# Patient Record
Sex: Male | Born: 1995 | Race: White | Hispanic: No | Marital: Single | Smoking: Never smoker
Health system: Southern US, Community
[De-identification: ages and names within clinical notes are randomized; demographics above are authoritative.]

## PROBLEM LIST (undated history)

## (undated) DIAGNOSIS — R569 Unspecified convulsions: Secondary | ICD-10-CM

## (undated) DIAGNOSIS — F840 Autistic disorder: Secondary | ICD-10-CM

## (undated) DIAGNOSIS — F6381 Intermittent explosive disorder: Secondary | ICD-10-CM

## (undated) HISTORY — DX: Unspecified convulsions: R56.9

## (undated) HISTORY — DX: Autistic disorder: F84.0

## (undated) HISTORY — PX: CRANIOTOMY FOR SUBDURAL IMPLANTATION OF ELECTRODE ARRAY: SUR340

## (undated) HISTORY — PX: OTHER SURGICAL HISTORY: SHX169

---
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
## 2014-11-01 ENCOUNTER — Ambulatory Visit (INDEPENDENT_AMBULATORY_CARE_PROVIDER_SITE_OTHER): Payer: BLUE CROSS/BLUE SHIELD | Admitting: Emergency Medicine

## 2014-11-01 ENCOUNTER — Emergency Department (HOSPITAL_COMMUNITY)
Admission: EM | Admit: 2014-11-01 | Discharge: 2014-11-01 | Disposition: A | Discharge status: Home / Self Care | Payer: BLUE CROSS/BLUE SHIELD | Attending: Emergency Medicine | Admitting: Emergency Medicine

## 2014-11-01 ENCOUNTER — Emergency Department (HOSPITAL_COMMUNITY): Payer: BLUE CROSS/BLUE SHIELD

## 2014-11-01 ENCOUNTER — Encounter (HOSPITAL_COMMUNITY): Payer: Self-pay | Admitting: Emergency Medicine

## 2014-11-01 VITALS — BP 124/80 | HR 88 | Temp 98.2°F | Resp 17 | Ht 67.0 in | Wt 120.0 lb

## 2014-11-01 DIAGNOSIS — F840 Autistic disorder: Secondary | ICD-10-CM | POA: Insufficient documentation

## 2014-11-01 DIAGNOSIS — Z79899 Other long term (current) drug therapy: Secondary | ICD-10-CM | POA: Insufficient documentation

## 2014-11-01 DIAGNOSIS — S0181XA Laceration without foreign body of other part of head, initial encounter: Secondary | ICD-10-CM

## 2014-11-01 DIAGNOSIS — W1839XA Other fall on same level, initial encounter: Secondary | ICD-10-CM | POA: Insufficient documentation

## 2014-11-01 DIAGNOSIS — Y939 Activity, unspecified: Secondary | ICD-10-CM | POA: Diagnosis not present

## 2014-11-01 DIAGNOSIS — R569 Unspecified convulsions: Secondary | ICD-10-CM

## 2014-11-01 DIAGNOSIS — G40909 Epilepsy, unspecified, not intractable, without status epilepticus: Secondary | ICD-10-CM | POA: Diagnosis not present

## 2014-11-01 DIAGNOSIS — Y998 Other external cause status: Secondary | ICD-10-CM | POA: Insufficient documentation

## 2014-11-01 DIAGNOSIS — S0990XA Unspecified injury of head, initial encounter: Secondary | ICD-10-CM

## 2014-11-01 DIAGNOSIS — Y929 Unspecified place or not applicable: Secondary | ICD-10-CM | POA: Insufficient documentation

## 2014-11-01 HISTORY — DX: Intermittent explosive disorder: F63.81

## 2014-11-01 LAB — GLUCOSE, POCT (MANUAL RESULT ENTRY): POC Glucose: 102 mg/dl — AB (ref 70–99)

## 2014-11-01 NOTE — ED Notes (Addendum)
Patient returned from CT. Care giver at bedside.

## 2014-11-01 NOTE — Progress Notes (Addendum)
   Subjective:    Patient ID: Roy Price, male    DOB: 05-30-1996, 19 y.o.   MRN: [id number] This chart was scribed for Lesle ChrisSteven Starr Urias, MD by Jolene Provostobert Halas, Medical Scribe. This patient was seen in Room 7 and the patient's care was started at 2:17 PM.  HPI  Pt has severe autism, and cannot communicate. Pt's caregiver and mother dictated the HPI. HPI Comments: Roy Price is a 19 y.o. male who [facility] with a hx of autism, and seizure disorder that had a seizure that lasted approximently 1 minute at around 12:30 PM. When the seizure began the pt fell from a standing posture and hit his head on the ground. Pt has multiple lascerations on his face. Pt's mother states that he currently has one seizure every three weeks. Pt's last seizure was Thursday. Pt's PCP is Maryellen Pileavid Rubin. Pt's neurog[facility]. The Pt is also [facility] at [facility].    Review of Systems  Unable to perform ROS: Other  Severe autism, nonverbal.     Objective:   Physical Exam  Constitutional: He is oriented to person, place, and time. He appears well-developed and well-nourished. No distress.  Patient laying stretcher with laceration in the fright frontal area of face. Minimal verbal communication. Answers simple questions but not correctly. PEERL. No obvious cranial nerve signs. Pt able to lift arms and legs.  HENT:  Head: Normocephalic and atraumatic.  Eyes: Pupils are equal, round, and reactive to light.  Neck: Neck supple.  Cardiovascular: Normal rate, regular rhythm and normal heart sounds.  Exam reveals no gallop and no friction rub.   No murmur heard. Pulmonary/Chest: Effort normal and breath sounds normal. No respiratory distress. He has no wheezes.  Abdominal: Soft. There is no tenderness.  Musculoskeletal: Normal range of motion.  Neurological: He is alert and oriented to person, place, and time. Coordination normal.  Somewhat somnolent. Essentially nonverbal. No focal  cranial nerve or moter sensory abnormalalities.   Skin: Skin is warm and dry. He is not diaphoretic.  Psychiatric: He has a normal mood and affect. His behavior is normal.  Nursing note and vitals reviewed.  Results for orders placed or performed in visit on 11/01/14  POCT glucose (manual entry)  Result Value Ref Range   POC Glucose 102 (A) 70 - 99 mg/dl       Assessment & Plan:   Pt with a long hx of seizures. Pt fell [place] [employer] striking his right forehead on floor with a 1 min seizure. Pt has a long hx of hard to control seizures. Last seizure on Thursday. Pt sent to hospital to consider imaging due to the fall, and difficult to evaluate neurological statsus due to his Asperger's syndrome. Pt stable during his time at urgent care, and seemed more responsive  prior to transport, consistent with post ictal state. I personally performed the services described in this documentation, which was scribed in my presence. The recorded information has been reviewed and is accurate.

## 2014-11-01 NOTE — ED Provider Notes (Signed)
CSN: [id number]     Arrival date & time 11/01/14  1504 History   First MD Initiated Contact with Patient 11/01/14 [phone]     Chief Complaint  Patient presents with  . Seizures     (Consider location/radiation/quality/duration/timing/severity/associated sxs/prior Treatment) The history is provided by the patient and a parent. History limited by: Level V caveat: Autism and communication deficits.   patient is brought to the emergency department after a seizure which was witnessed by his babysitter.  He is 19 years old and has a history of seizure disorder and autism.  He recently was seen at the [facility] clinic in November 2015 and was switched to Vimpat as a sole epileptic agent.  Since switching he has definitely decreased his seizures and his frequency of seizures per the family.  Family reports that the seizure seems somewhat closer together as compared to his previous seizures.  Only one witnessed seizure.  Normal postictal period per family.  Family reports returned to baseline mental status at this time.  Noted to be moving all 4 extremities in the bed.  Past Medical History  Diagnosis Date  . Autism   . Seizures   . Intermittent explosive disorder    Past Surgical History  Procedure Laterality Date  . Brain surgery    . Arm fracture      due to seizures  . Broken leg    . Craniotomy for subdural implantation of electrode array    . Other surgical history      Bilateral Frontal Sub- Pial Resections   Family History  Problem Relation Age of Onset  . Alcohol abuse Neg Hx   . Migraines Mother     Intractable Migraines  . Hypothyroidism Mother   . Stroke Maternal Grandfather     Died at 50   History  Substance Use Topics  . Smoking status: Never Smoker   . Smokeless tobacco: Never Used  . Alcohol Use: No    Review of Systems  Unable to perform ROS     Allergies  Dairy aid  Home Medications   Prior to Admission medications   Medication Sig Start Date End Date  Taking? Authorizing Provider  cholecalciferol (VITAMIN D) 400 UNITS TABS Take 2,000 Units by mouth at bedtime.     Historical Provider, MD  co-enzyme Q-10 30 MG capsule Take 200 mg by mouth at bedtime.    Historical Provider, MD  COQ10-ACETYLCARN-CARNOSINE PO Take 2 tablets by mouth daily at 12 noon.     Historical Provider, MD  Cyanocobalamin (VITAMIN B 12 PO) Take 1 Wafer by mouth at bedtime.    Historical Provider, MD  Cyanocobalamin (VITAMIN B-12 PO) Take 1 tablet by mouth 2 (two) times daily. Morning and afternoon    Historical Provider, MD  lacosamide (VIMPAT) 50 MG TABS TAKE 2 AND 1/2 TABLETS BY MOUTH TWICE DAILY 02/17/13   Elveria Rising, NP  LamoTRIgine 200 MG TB24 Take 200 mg by mouth 2 (two) times daily.    Historical Provider, MD  LevOCARNitine (CARNITINE PO) Take 15 mLs by mouth daily.    Historical Provider, MD  Multiple Vitamin (MULTIVITAMIN) tablet Take 1 tablet by mouth daily.    Historical Provider, MD  Omega-3 Fatty Acids (FISH OIL PO) Take 3 capsules by mouth 2 (two) times daily. gummies    Historical Provider, MD  Prenatal Ca Carb-B6-B12-FA (BP FOLINATAL PLUS B PO) Take 2 capsules by mouth daily.     Historical Provider, MD  vitamin C (ASCORBIC ACID)  500 MG tablet Take 500 mg by mouth daily.    Historical Provider, MD  vitamin E 400 UNIT capsule Take 400 Units by mouth daily.    Historical Provider, MD   BP 127/74 mmHg  Pulse 79  Temp(Src) 97.8 F (36.6 C) (Oral)  Resp 20  Ht 5\' 9"  (1.753 m)  Wt 117 lb (53.071 kg)  BMI 17.27 kg/m2  SpO2 99% Physical Exam  Constitutional: He appears well-developed and well-nourished.  HENT:  Small 1.5 cm laceration to his right anterior forehead without active bleeding.  Small abrasion to his chin and small abrasion to his right cheek.  No trismus or malocclusion.  Dentition is normal.  Eyes: EOM are normal.  Neck: Normal range of motion. Neck supple.  C-spine is nontender without cervical step-offs  Cardiovascular: Normal rate,  regular rhythm, normal heart sounds and intact distal pulses.   Pulmonary/Chest: Effort normal and breath sounds normal. No respiratory distress.  Abdominal: Soft. He exhibits no distension. There is no tenderness.  Musculoskeletal: Normal range of motion.  Full range of motion in major joints.  Grossly 5 out of 5 strength to bilateral upper lower extremity major muscle groups.  Neurological: He is alert.  Baseline mental status per family.  Follow simple commands  Skin: Skin is warm and dry.  Psychiatric: He has a normal mood and affect. Judgment normal.  Nursing note and vitals reviewed.   ED Course  Procedures (including critical care time)  LACERATION REPAIR Performed by: Lyanne CoAMPOS,Shanae Luo M Consent: Verbal consent obtained. Risks and benefits: risks, benefits and alternatives were discussed Patient identity confirmed: provided demographic data Time out performed prior to procedure Prepped and Draped in normal sterile fashion Wound explored Laceration Location: right forehead Laceration Length: 1.5cm No Foreign Bodies seen or palpated Anesthesia: none Amount of cleaning: standard Skin closure: dermabond Number of sutures or staples: dermabond Technique: dermabond Patient tolerance: Patient tolerated the procedure well with no immediate complications.   Labs Review Labs Reviewed - No data to display  Imaging Review Ct Head Wo Contrast  11/01/2014   CLINICAL DATA:  Seizure. Fall from standing position. Laceration above the right eyebrow. Prior craniotomy.  EXAM: CT HEAD WITHOUT CONTRAST  CT CERVICAL SPINE WITHOUT CONTRAST  TECHNIQUE: Multidetector CT imaging of the head and cervical spine was performed following the standard protocol without intravenous contrast. Multiplanar CT image reconstructions of the cervical spine were also generated.  COMPARISON:  Multiple exams, including 06/13/2012 and 05/23/2011  FINDINGS: CT HEAD FINDINGS  The brainstem, cerebellum, cerebral peduncles,  thalamus, basal ganglia, basilar cisterns, and ventricular system appear within normal limits. No intracranial hemorrhage, mass lesion, or acute CVA.  Small amount of gas in the subcutaneous tissues above the right superior orbital rim and along the right forehead, compatible with laceration. Small metallic clip like structures along the inner table of the skull bilaterally with calvarial thinning and suggestion of prior craniotomies. I do not observe an acute calvarial fracture.  Minimal chronic left maxillary sinusitis.  CT CERVICAL SPINE FINDINGS  No cervical spine fracture or significant abnormal subluxation. No prevertebral soft tissue swelling or significant bony lesion observed.  IMPRESSION: 1. No acute intracranial or acute cervical spine findings. 2. Gas in the subcutaneous tissues the right forehead compatible with laceration. 3. Minimal chronic left maxillary sinusitis. 4. Postoperative findings along the calvarium.   Electronically Signed   By: Gaylyn RongWalter  Liebkemann M.D.   On: 11/01/2014 16:49   Ct Cervical Spine Wo Contrast  11/01/2014   CLINICAL DATA:  Seizure. Fall from standing position. Laceration above the right eyebrow. Prior craniotomy.  EXAM: CT HEAD WITHOUT CONTRAST  CT CERVICAL SPINE WITHOUT CONTRAST  TECHNIQUE: Multidetector CT imaging of the head and cervical spine was performed following the standard protocol without intravenous contrast. Multiplanar CT image reconstructions of the cervical spine were also generated.  COMPARISON:  Multiple exams, including 06/13/2012 and 05/23/2011  FINDINGS: CT HEAD FINDINGS  The brainstem, cerebellum, cerebral peduncles, thalamus, basal ganglia, basilar cisterns, and ventricular system appear within normal limits. No intracranial hemorrhage, mass lesion, or acute CVA.  Small amount of gas in the subcutaneous tissues above the right superior orbital rim and along the right forehead, compatible with laceration. Small metallic clip like structures along the  inner table of the skull bilaterally with calvarial thinning and suggestion of prior craniotomies. I do not observe an acute calvarial fracture.  Minimal chronic left maxillary sinusitis.  CT CERVICAL SPINE FINDINGS  No cervical spine fracture or significant abnormal subluxation. No prevertebral soft tissue swelling or significant bony lesion observed.  IMPRESSION: 1. No acute intracranial or acute cervical spine findings. 2. Gas in the subcutaneous tissues the right forehead compatible with laceration. 3. Minimal chronic left maxillary sinusitis. 4. Postoperative findings along the calvarium.   Electronically Signed   By: Gaylyn Rong M.D.   On: 11/01/2014 16:49  I personally reviewed the imaging tests through PACS system I reviewed available ER/hospitalization records through the EMR    EKG Interpretation None      MDM   Final diagnoses:  None    Patient returned to baseline mental status.  Imaging negative.  Laceration repaired.  Infection warnings given.  Head injury warnings given.  Primary care follow-up.  He will touch base with his neurology team as well.    Lyanne Co, MD 11/01/14 [phone]

## 2014-11-01 NOTE — Discharge Instructions (Signed)
Tissue Adhesive Wound Care °Some cuts, wounds, lacerations, and incisions can be repaired by using tissue adhesive. Tissue adhesive is like glue. It holds the skin together, allowing for faster healing. It forms a strong bond on the skin in about 1 minute and reaches its full strength in about 2 or 3 minutes. The adhesive disappears naturally while the wound is healing. It is important to take proper care of your wound at home while it heals.  °HOME CARE INSTRUCTIONS  °· Showers are allowed. Do not soak the area containing the tissue adhesive. Do not take baths, swim, or use hot tubs. Do not use any soaps or ointments on the wound. Certain ointments can weaken the glue. °· If a bandage (dressing) has been applied, follow your health care provider's instructions for how often to change the dressing.   °· Keep the dressing dry if one has been applied.   °· Do not scratch, pick, or rub the adhesive.   °· Do not place tape over the adhesive. The adhesive could come off when pulling the tape off.   °· Protect the wound from further injury until it is healed.   °· Protect the wound from sun and tanning bed exposure while it is healing and for several weeks after healing.   °· Only take over-the-counter or prescription medicines as directed by your health care provider.   °· Keep all follow-up appointments as directed by your health care provider. °SEEK IMMEDIATE MEDICAL CARE IF:  °· Your wound becomes red, swollen, hot, or tender.   °· You develop a rash after the glue is applied. °· You have increasing pain in the wound.   °· You have a red streak that goes away from the wound.   °· You have pus coming from the wound.   °· You have increased bleeding. °· You have a fever. °· You have shaking chills.   °· You notice a bad smell coming from the wound.   °· Your wound or adhesive breaks open.   °MAKE SURE YOU:  °· Understand these instructions. °· Will watch your condition. °· Will get help right away if you are not doing  well or get worse. °Document Released: 02/14/2001 Document Revised: 06/11/2013 Document Reviewed: 03/12/2013 °Exit[employer]® Patient Information ©2015 [employer]. This information is not intended to replace advice given to you by your health care provider. Make sure you discuss any questions you have with your health care provider. ° °

## 2014-11-01 NOTE — ED Notes (Addendum)
History of autism and epilepsy; at lunch with caregiver, had a seizure and fell from standing. 2 inch laceration above right eyebrow. Per mother, he is at baseline for his neuro status. [facility] and soft cervical collar placed there and arranged transport to ED.

## 2016-07-20 ENCOUNTER — Encounter (HOSPITAL_COMMUNITY): Payer: Self-pay

## 2016-07-20 ENCOUNTER — Emergency Department (HOSPITAL_COMMUNITY): Payer: BLUE CROSS/BLUE SHIELD

## 2016-07-20 ENCOUNTER — Emergency Department (HOSPITAL_COMMUNITY)
Admission: EM | Admit: 2016-07-20 | Discharge: 2016-07-20 | Disposition: A | Discharge status: Home / Self Care | Payer: BLUE CROSS/BLUE SHIELD | Attending: Emergency Medicine | Admitting: Emergency Medicine

## 2016-07-20 DIAGNOSIS — S0181XA Laceration without foreign body of other part of head, initial encounter: Secondary | ICD-10-CM

## 2016-07-20 DIAGNOSIS — S50811A Abrasion of right forearm, initial encounter: Secondary | ICD-10-CM | POA: Insufficient documentation

## 2016-07-20 DIAGNOSIS — S01112A Laceration without foreign body of left eyelid and periocular area, initial encounter: Secondary | ICD-10-CM | POA: Insufficient documentation

## 2016-07-20 DIAGNOSIS — F840 Autistic disorder: Secondary | ICD-10-CM | POA: Diagnosis not present

## 2016-07-20 DIAGNOSIS — G40909 Epilepsy, unspecified, not intractable, without status epilepticus: Secondary | ICD-10-CM | POA: Diagnosis not present

## 2016-07-20 DIAGNOSIS — Y92219 Unspecified school as the place of occurrence of the external cause: Secondary | ICD-10-CM | POA: Diagnosis not present

## 2016-07-20 DIAGNOSIS — Y939 Activity, unspecified: Secondary | ICD-10-CM | POA: Diagnosis not present

## 2016-07-20 DIAGNOSIS — R569 Unspecified convulsions: Secondary | ICD-10-CM

## 2016-07-20 DIAGNOSIS — Y999 Unspecified external cause status: Secondary | ICD-10-CM | POA: Diagnosis not present

## 2016-07-20 DIAGNOSIS — W2209XA Striking against other stationary object, initial encounter: Secondary | ICD-10-CM | POA: Insufficient documentation

## 2016-07-20 DIAGNOSIS — S0993XA Unspecified injury of face, initial encounter: Secondary | ICD-10-CM | POA: Diagnosis present

## 2016-07-20 LAB — CBG MONITORING, ED: Glucose-Capillary: 116 mg/dL — ABNORMAL HIGH (ref 65–99)

## 2016-07-20 MED ORDER — ACETAMINOPHEN 500 MG PO TABS
1000.0000 mg | ORAL_TABLET | Freq: Once | ORAL | Status: AC
  Administered 2016-07-20: 1000 mg via ORAL
  Filled 2016-07-20: qty 2

## 2016-07-20 MED ORDER — LIDOCAINE-EPINEPHRINE-TETRACAINE (LET) SOLUTION
3.0000 mL | Freq: Once | NASAL | Status: AC
  Administered 2016-07-20: 3 mL via TOPICAL
  Filled 2016-07-20: qty 3

## 2016-07-20 MED ORDER — LIDOCAINE-EPINEPHRINE 1 %-1:100000 IJ SOLN
20.0000 mL | Freq: Once | INTRAMUSCULAR | Status: AC
  Administered 2016-07-20: 20 mL via INTRADERMAL
  Filled 2016-07-20: qty 1

## 2016-07-20 NOTE — ED Notes (Signed)
Dr. Adela LankFloyd at bedside to suture lac.

## 2016-07-20 NOTE — ED Notes (Signed)
EKG given to Dr. Floyd 

## 2016-07-20 NOTE — ED Notes (Signed)
Notified Brooke H (RN) pt CBG was 116.

## 2016-07-20 NOTE — ED Triage Notes (Signed)
Pt presents with witnessed seizure while at school.  Pt fell out of cafeteria chair with seizure x 45 seconds.  Pt has h/o seizures.

## 2016-07-20 NOTE — Discharge Instructions (Signed)
Look for redness, drainage, fever.  Return if this occurs.

## 2016-07-20 NOTE — ED Notes (Signed)
Family updated on status for CT scan.

## 2016-07-20 NOTE — ED Provider Notes (Signed)
[facility]-EMERGENCY DEPT Provider Note   CSN: [id number] Arrival date & time: 07/20/16  1345     History   Chief Complaint Chief Complaint  Patient presents with  . Seizures    HPI Roy Price is a 20 y.o. male.  [age] yo M with a chief complaint of a seizure. Patient has a history of autism as well as a seizure disorder. He was at school today when he had a seizure and struck his head on a desk. He is now back to his baseline. Is on [facility]. They deny any recent medication changes. He has been compliant with his medications.   The history is provided by the patient and a parent.  Seizures   This is a recurrent problem. The current episode started less than 1 hour ago. The problem has not changed since onset.There was 1 seizure. The most recent episode lasted 30 to 120 seconds. Pertinent negatives include no confusion, no headaches, no visual disturbance, no chest pain, no vomiting and no diarrhea. The episode was witnessed. There was no sensation of an aura present. The seizures did not continue in the ED. The seizure(s) had no focality. There has been no fever.    Past Medical History:  Diagnosis Date  . Autism   . Intermittent explosive disorder   . Seizures [facility](HCC)     Patient Active Problem List   Diagnosis Date Noted  . 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 Localization-related (focal) (partial) epilepsy and epileptic syndromes with complex partial seizures, without mention of intractable epilepsy 01/17/2013  . Intermittent explosive disorder 01/17/2013  . Nocturnal enuresis 01/17/2013  . Autistic disorder, current or active state 01/17/2013  . 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  . Leukocytopenia, unspecified 01/17/2013  . Encounter for long-term (current) use of other medications 01/17/2013  . Autism 06/17/2012  . Seizures (HCC) 06/17/2012    Past Surgical History:  Procedure Laterality  Date  . arm fracture     due to seizures  . brain surgery    . broken leg    . CRANIOTOMY FOR SUBDURAL IMPLANTATION OF ELECTRODE ARRAY    . OTHER SURGICAL HISTORY     Bilateral Frontal Sub- Pial Resections       Home Medications    Prior to Admission medications   Medication Sig Start Date End Date Taking? Authorizing Provider  cholecalciferol (VITAMIN D) 400 UNITS TABS Take 2,000 Units by mouth at bedtime.    Yes Historical Provider, MD  co-enzyme Q-10 30 MG capsule Take 30 mg by mouth at bedtime.    Yes Historical Provider, MD  Lacosamide (VIMPAT) 100 MG TABS Take 300 mg by mouth 2 (two) times daily.   Yes Historical Provider, MD  Omega-3 Fatty Acids (FISH OIL PO) Take 1 capsule by mouth daily. gummies    Yes Historical Provider, MD  PREVIDENT 5000 BOOSTER PLUS 1.1 % PSTE 1 application See admin instructions. Daily as directed 05/23/16  Yes Historical Provider, MD  vitamin C (ASCORBIC ACID) 500 MG tablet Take 500 mg by mouth daily.   Yes Historical Provider, MD  vitamin E 400 UNIT capsule Take 400 Units by mouth daily.   Yes Historical Provider, MD  lacosamide (VIMPAT) 50 MG TABS TAKE 2 AND 1/2 TABLETS BY MOUTH TWICE DAILY Patient not taking: Reported on 07/20/2016 02/17/13   Elveria Risingina Goodpasture, NP    Family History Family History  Problem Relation Age of Onset  . Migraines  Mother     Intractable Migraines  . Hypothyroidism Mother   . Stroke Maternal Grandfather     Died at 110  . Alcohol abuse Neg Hx     Social History Social History  Substance Use Topics  . Smoking status: Never Smoker  . Smokeless tobacco: Never Used  . Alcohol use No     Allergies   Dairy aid [lactase]   Review of Systems Review of Systems  Constitutional: Negative for chills and fever.  HENT: Negative for congestion and facial swelling.   Eyes: Negative for discharge and visual disturbance.  Respiratory: Negative for shortness of breath.   Cardiovascular: Negative for chest pain and  palpitations.  Gastrointestinal: Negative for abdominal pain, diarrhea and vomiting.  Musculoskeletal: Negative for arthralgias and myalgias.  Skin: Negative for color change and rash.  Neurological: Positive for seizures. Negative for tremors, syncope and headaches.  Psychiatric/Behavioral: Negative for confusion and dysphoric mood.     Physical Exam Updated Vital Signs BP 108/66   Pulse 82   Resp 19   SpO2 96%   Physical Exam  Constitutional: He appears well-developed and well-nourished.  HENT:  Head: Normocephalic and atraumatic.  2.6 cm laceration above the left eyebrow.  Eyes: EOM are normal. Pupils are equal, round, and reactive to light.  Neck: Normal range of motion. Neck supple. No JVD present.  Cardiovascular: Normal rate and regular rhythm.  Exam reveals no gallop and no friction rub.   No murmur heard. Pulmonary/Chest: No respiratory distress. He has no wheezes.  Abdominal: He exhibits no distension and no mass. There is no tenderness. There is no rebound and no guarding.  Musculoskeletal: Normal range of motion.  Small abrasion to the right distal forearm.  He was palpated from head to toe with no other noted areas of bony tenderness. No midline spinal tenderness.  Neurological: He is alert.  Patient is moving all 4 extremities without difficulty.  Skin: No rash noted. No pallor.  Psychiatric: He has a normal mood and affect. His behavior is normal.  Nursing note and vitals reviewed.    ED Treatments / Results  Labs (all labs ordered are listed, but only abnormal results are displayed) Labs Reviewed  CBG MONITORING, ED - Abnormal; Notable for the following:       Result Value   Glucose-Capillary 116 (*)    All other components within normal limits    EKG  EKG Interpretation None      ED ECG REPORT   Date: 07/20/2016  Rate: 113  Rhythm: sinus tachycardia  QRS Axis: left  Intervals: normal  ST/T Wave abnormalities: normal  Conduction  Disutrbances:none  Narrative Interpretation:   Old EKG Reviewed: unchanged  I have personally reviewed the EKG tracing and agree with the computerized printout as noted.  Radiology Ct Head Wo Contrast  Result Date: 07/20/2016 CLINICAL DATA:  Head injury. Witnessed seizure. Fall from standing position, hitting head on a chair. Right frontal region laceration. Initial encounter. EXAM: CT HEAD WITHOUT CONTRAST CT CERVICAL SPINE WITHOUT CONTRAST TECHNIQUE: Multidetector CT imaging of the head and cervical spine was performed following the standard protocol without intravenous contrast. Multiplanar CT image reconstructions of the cervical spine were also generated. COMPARISON:  11/01/2014 FINDINGS: CT HEAD FINDINGS Brain: There is no evidence of acute cortical infarct, intracranial hemorrhage, mass, midline shift, or extra-axial fluid collection. Slight prominence of the ventricles and sulci for age is unchanged. Vascular: No hyperdense vessel or unexpected calcification. Skull: Prior craniotomy changes bilaterally. No  evidence of acute fracture. Sinuses/Orbits: No acute finding. Other: None. CT CERVICAL SPINE FINDINGS Alignment: Cervical spine straightening.  No listhesis. Skull base and vertebrae: No acute fracture. No primary bone lesion or focal pathologic process. Soft tissues and spinal canal: No prevertebral fluid or swelling. No visible canal hematoma. Disc levels: Preserved disc space heights without evidence of significant degenerative change. Upper chest: Unremarkable. Other: None. IMPRESSION: 1. No evidence of acute intracranial abnormality. 2. No evidence of acute cervical spine fracture or subluxation. Electronically Signed   By: Sebastian Ache M.D.   On: 07/20/2016 15:12   Ct Cervical Spine Wo Contrast  Result Date: 07/20/2016 CLINICAL DATA:  Head injury. Witnessed seizure. Fall from standing position, hitting head on a chair. Right frontal region laceration. Initial encounter. EXAM: CT HEAD  WITHOUT CONTRAST CT CERVICAL SPINE WITHOUT CONTRAST TECHNIQUE: Multidetector CT imaging of the head and cervical spine was performed following the standard protocol without intravenous contrast. Multiplanar CT image reconstructions of the cervical spine were also generated. COMPARISON:  11/01/2014 FINDINGS: CT HEAD FINDINGS Brain: There is no evidence of acute cortical infarct, intracranial hemorrhage, mass, midline shift, or extra-axial fluid collection. Slight prominence of the ventricles and sulci for age is unchanged. Vascular: No hyperdense vessel or unexpected calcification. Skull: Prior craniotomy changes bilaterally. No evidence of acute fracture. Sinuses/Orbits: No acute finding. Other: None. CT CERVICAL SPINE FINDINGS Alignment: Cervical spine straightening.  No listhesis. Skull base and vertebrae: No acute fracture. No primary bone lesion or focal pathologic process. Soft tissues and spinal canal: No prevertebral fluid or swelling. No visible canal hematoma. Disc levels: Preserved disc space heights without evidence of significant degenerative change. Upper chest: Unremarkable. Other: None. IMPRESSION: 1. No evidence of acute intracranial abnormality. 2. No evidence of acute cervical spine fracture or subluxation. Electronically Signed   By: Sebastian Ache M.D.   On: 07/20/2016 15:12    Procedures .Marland KitchenLaceration Repair Date/Time: 07/20/2016 3:22 PM Performed by: Adela Lank Caterina Racine Authorized by: Melene Plan   Consent:    Consent obtained:  Verbal   Consent given by:  Parent   Risks discussed:  Infection, pain, poor cosmetic result and poor wound healing   Alternatives discussed:  Delayed treatment, observation and no treatment Anesthesia (see MAR for exact dosages):    Anesthesia method:  Local infiltration   Local anesthetic:  Lidocaine 1% WITH epi Laceration details:    Location:  Face   Face location:  L eyebrow   Length (cm):  2.6 Repair type:    Repair type:  Simple Pre-procedure details:      Preparation:  Patient was prepped and draped in usual sterile fashion Exploration:    Contaminated: no   Treatment:    Wound cleansed with: chlorihexidene.   Amount of cleaning:  Standard   Irrigation solution:  Sterile saline   Irrigation volume:  20   Irrigation method:  Syringe   Visualized foreign bodies/material removed: no   Skin repair:    Repair method:  Sutures   Suture size:  5-0   Suture material:  Fast-absorbing gut   Suture technique:  Simple interrupted   Number of sutures:  2 Approximation:    Approximation:  Close Post-procedure details:    Dressing:  Open (no dressing)   Patient tolerance of procedure:  Tolerated well, no immediate complications    (including critical care time)  Medications Ordered in ED Medications  lidocaine-EPINEPHrine-tetracaine (LET) solution (3 mLs Topical Given 07/20/16 1411)  acetaminophen (TYLENOL) tablet 1,000 mg (1,000 mg Oral  Given 07/20/16 1410)  lidocaine-EPINEPHrine (XYLOCAINE W/EPI) 1 %-1:100000 (with pres) injection 20 mL (20 mLs Intradermal Given 07/20/16 1411)     Initial Impression / Assessment and Plan / ED Course  I have reviewed the triage vital signs and the nursing notes.  Pertinent labs & imaging results that were available during my care of the patient were reviewed by me and considered in my medical decision making (see chart for details).  Clinical Course     20 yo M With a chief complaint of a seizure. Patient has a history of a seizure disorder is at his baseline currently. He also has a laceration above the left eyebrow. Will obtain a CT of the head and C-spine. Repair the wound at bedside.  3:27 PM:  I have discussed the diagnosis/risks/treatment options with the patient and family and believe the pt to be eligible for discharge home to follow-up with PCP. We also discussed returning to the ED immediately if new or worsening sx occur. We discussed the sx which are most concerning (e.g., redness,  drainage) that necessitate immediate return. Medications administered to the patient during their visit and any new prescriptions provided to the patient are listed below.  Medications given during this visit Medications  lidocaine-EPINEPHrine-tetracaine (LET) solution (3 mLs Topical Given 07/20/16 1411)  acetaminophen (TYLENOL) tablet 1,000 mg (1,000 mg Oral Given 07/20/16 1410)  lidocaine-EPINEPHrine (XYLOCAINE W/EPI) 1 %-1:100000 (with pres) injection 20 mL (20 mLs Intradermal Given 07/20/16 1411)     The patient appears reasonably screen and/or stabilized for discharge and I doubt any other medical condition or other [facility] requiring further screening, evaluation, or treatment in the ED at this time prior to discharge.    Final Clinical Impressions(s) / ED Diagnoses   Final diagnoses:  Seizure [facility](HCC)  Facial laceration, initial encounter    New Prescriptions New Prescriptions   No medications on file     Melene PlanDan Teola Felipe, DO 07/20/16 1527

## 2017-01-12 ENCOUNTER — Encounter (HOSPITAL_COMMUNITY): Payer: Self-pay | Admitting: Emergency Medicine

## 2017-01-12 ENCOUNTER — Emergency Department (HOSPITAL_COMMUNITY): Payer: Medicaid Other

## 2017-01-12 ENCOUNTER — Emergency Department (HOSPITAL_COMMUNITY)
Admission: EM | Admit: 2017-01-12 | Discharge: 2017-01-12 | Disposition: A | Discharge status: Home / Self Care | Payer: Medicaid Other | Attending: Emergency Medicine | Admitting: Emergency Medicine

## 2017-01-12 DIAGNOSIS — S0990XA Unspecified injury of head, initial encounter: Secondary | ICD-10-CM | POA: Diagnosis present

## 2017-01-12 DIAGNOSIS — F840 Autistic disorder: Secondary | ICD-10-CM | POA: Diagnosis not present

## 2017-01-12 DIAGNOSIS — S60511A Abrasion of right hand, initial encounter: Secondary | ICD-10-CM | POA: Diagnosis not present

## 2017-01-12 DIAGNOSIS — S0081XA Abrasion of other part of head, initial encounter: Secondary | ICD-10-CM | POA: Diagnosis not present

## 2017-01-12 DIAGNOSIS — R569 Unspecified convulsions: Secondary | ICD-10-CM | POA: Diagnosis not present

## 2017-01-12 DIAGNOSIS — Y999 Unspecified external cause status: Secondary | ICD-10-CM | POA: Diagnosis not present

## 2017-01-12 DIAGNOSIS — Y9301 Activity, walking, marching and hiking: Secondary | ICD-10-CM | POA: Insufficient documentation

## 2017-01-12 DIAGNOSIS — W228XXA Striking against or struck by other objects, initial encounter: Secondary | ICD-10-CM | POA: Diagnosis not present

## 2017-01-12 DIAGNOSIS — Y92480 Sidewalk as the place of occurrence of the external cause: Secondary | ICD-10-CM | POA: Diagnosis not present

## 2017-01-12 DIAGNOSIS — S60512A Abrasion of left hand, initial encounter: Secondary | ICD-10-CM | POA: Insufficient documentation

## 2017-01-12 DIAGNOSIS — S60519A Abrasion of unspecified hand, initial encounter: Secondary | ICD-10-CM

## 2017-01-12 MED ORDER — BACITRACIN ZINC 500 UNIT/GM EX OINT
TOPICAL_OINTMENT | Freq: Once | CUTANEOUS | Status: AC
  Administered 2017-01-12: 2 via TOPICAL
  Filled 2017-01-12: qty 1.8

## 2017-01-12 NOTE — ED Notes (Signed)
Made multiple attempts to contact pt mother and father. Number goes straight to vmail. Pt teacher at bedside and sts that she has made attempts as well but unable to contact.

## 2017-01-12 NOTE — Discharge Instructions (Signed)
There is a slight abnormality on the left frontal lobe which is chronic. Not from the fall but could be associated with seizures. Neurology may need to follow this. Besides the abrasions no apparent severe injury from the fall.
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
## 2017-04-07 ENCOUNTER — Ambulatory Visit (INDEPENDENT_AMBULATORY_CARE_PROVIDER_SITE_OTHER): Payer: BLUE CROSS/BLUE SHIELD | Admitting: Emergency Medicine

## 2017-04-07 ENCOUNTER — Encounter: Payer: Self-pay | Admitting: Emergency Medicine

## 2017-04-07 VITALS — BP 117/76 | HR 93 | Temp 98.0°F | Resp 18 | Ht 68.9 in | Wt 114.0 lb

## 2017-04-07 DIAGNOSIS — F840 Autistic disorder: Secondary | ICD-10-CM

## 2017-04-07 DIAGNOSIS — R569 Unspecified convulsions: Secondary | ICD-10-CM | POA: Diagnosis not present

## 2017-04-07 DIAGNOSIS — S0181XA Laceration without foreign body of other part of head, initial encounter: Secondary | ICD-10-CM

## 2017-04-07 NOTE — Patient Instructions (Addendum)
   IF you received an x-ray today, you will receive an invoice from [facility]. Please contact [facility] at [phone] with questions or concerns regarding your invoice.   IF you received labwork today, you will receive an invoice from [facility]. Please contact [facility] at [phone] with questions or concerns regarding your invoice.   Our billing staff will not be able to assist you with questions regarding bills from these companies.  You will be contacted with the lab results as soon as they are available. The fastest way to get your results is to activate your My Chart account. Instructions are located on the last page of this paperwork. If you have not heard from us regarding the results in 2 weeks, please contact this office.     Laceration Care, Adult A laceration is a cut that goes through all layers of the skin. The cut also goes into the tissue that is right under the skin. Some cuts heal on their own. Others need to be closed with stitches (sutures), staples, skin adhesive strips, or wound glue. Taking care of your cut lowers your risk of infection and helps your cut to heal better. How to take care of your cut For stitches or staples:  Keep the wound clean and dry.  If you were given a bandage (dressing), you should change it at least one time per day or as told by your doctor. You should also change it if it gets wet or dirty.  Keep the wound completely dry for the first 24 hours or as told by your doctor. After that time, you may take a shower or a bath. However, make sure that the wound is not soaked in water until after the stitches or staples have been removed.  Clean the wound one time each day or as told by your doctor: ? Wash the wound with soap and water. ? Rinse the wound with water until all of the soap comes off. ? Pat the wound dry with a clean towel. Do not rub the wound.  After you clean the wound, put a thin layer of antibiotic  ointment on it as told by your doctor. This ointment: ? Helps to prevent infection. ? Keeps the bandage from sticking to the wound.  Have your stitches or staples removed as told by your doctor. If your doctor used skin adhesive strips:  Keep the wound clean and dry.  If you were given a bandage, you should change it at least one time per day or as told by your doctor. You should also change it if it gets dirty or wet.  Do not get the skin adhesive strips wet. You can take a shower or a bath, but be careful to keep the wound dry.  If the wound gets wet, pat it dry with a clean towel. Do not rub the wound.  Skin adhesive strips fall off on their own. You can trim the strips as the wound heals. Do not remove any strips that are still stuck to the wound. They will fall off after a while. If your doctor used wound glue:  Try to keep your wound dry, but you may briefly wet it in the shower or bath. Do not soak the wound in water, such as by swimming.  After you take a shower or a bath, gently pat the wound dry with a clean towel. Do not rub the wound.  Do not do any activities that will make you really sweaty until   the skin glue has fallen off on its own.  Do not apply liquid, cream, or ointment medicine to your wound while the skin glue is still on.  If you were given a bandage, you should change it at least one time per day or as told by your doctor. You should also change it if it gets dirty or wet.  If a bandage is placed over the wound, do not let the tape for the bandage touch the skin glue.  Do not pick at the glue. The skin glue usually stays on for 5-10 days. Then, it falls off of the skin. General Instructions  To help prevent scarring, make sure to cover your wound with sunscreen whenever you are outside after stitches are removed, after adhesive strips are removed, or when wound glue stays in place and the wound is healed. Make sure to wear a sunscreen of at least 30  SPF.  Take over-the-counter and prescription medicines only as told by your doctor.  If you were given antibiotic medicine or ointment, take or apply it as told by your doctor. Do not stop using the antibiotic even if your wound is getting better.  Do not scratch or pick at the wound.  Keep all follow-up visits as told by your doctor. This is important.  Check your wound every day for signs of infection. Watch for: ? Redness, swelling, or pain. ? Fluid, blood, or pus.  Raise (elevate) the injured area above the level of your heart while you are sitting or lying down, if possible. Get help if:  You got a tetanus shot and you have any of these problems at the injection site: ? Swelling. ? Very bad pain. ? Redness. ? Bleeding.  You have a fever.  A wound that was closed breaks open.  You notice a bad smell coming from your wound or your bandage.  You notice something coming out of the wound, such as wood or glass.  Medicine does not help your pain.  You have more redness, swelling, or pain at the site of your wound.  You have fluid, blood, or pus coming from your wound.  You notice a change in the color of your skin near your wound.  You need to change the bandage often because fluid, blood, or pus is coming from the wound.  You start to have a new rash.  You start to have numbness around the wound. Get help right away if:  You have very bad swelling around the wound.  Your pain suddenly gets worse and is very bad.  You notice painful lumps near the wound or on skin that is anywhere on your body.  You have a red streak going away from your wound.  The wound is on your hand or foot and you cannot move a finger or toe like you usually can.  The wound is on your hand or foot and you notice that your fingers or toes look pale or bluish. This information is not intended to replace advice given to you by your health care provider. Make sure you discuss any questions  you have with your health care provider. Document Released: 02/07/2008 Document Revised: 01/27/2016 Document Reviewed: 08/17/2014 [employer] Interactive Patient Education  2018 [employer]  

## 2017-04-07 NOTE — Progress Notes (Signed)
Roy Price 20 y.o.   Chief Complaint  Patient presents with  . Fall    pt had a seizure today while standing up and feel    HISTORY OF PRESENT ILLNESS: This is a 21 y.o. male autistic presenting with facial laceration sustained today after a fall secondary to seizure; pt has h/o seizures; accompanied by father.  HPI   Prior to Admission medications   Medication Sig Start Date End Date Taking? Authorizing Provider  cholecalciferol (VITAMIN D) 400 UNITS TABS Take 2,000 Units by mouth at bedtime.    Yes [provider]  co-enzyme Q-10 30 MG capsule Take 30 mg by mouth at bedtime.    Yes [provider]  Lacosamide (VIMPAT) 100 MG TABS Take 300 mg by mouth 2 (two) times daily.   Yes [provider]  lacosamide (VIMPAT) 50 MG TABS TAKE 2 AND 1/2 TABLETS BY MOUTH TWICE DAILY 02/17/13  Yes Elveria RisingGoodpasture, Tina, NP  Omega-3 Fatty Acids (FISH OIL PO) Take 1 capsule by mouth daily. gummies    Yes [provider]  PREVIDENT 5000 BOOSTER PLUS 1.1 % PSTE 1 application See admin instructions. Daily as directed 05/23/16  Yes [provider]  vitamin C (ASCORBIC ACID) 500 MG tablet Take 500 mg by mouth daily.   Yes [provider]  vitamin E 400 UNIT capsule Take 400 Units by mouth daily.   Yes [provider]    Allergies  Allergen Reactions  . Dairy Aid [Lactase] Rash    Opioid effect     Patient Active Problem List   Diagnosis Date Noted  . 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 Localization-related (focal) (partial) epilepsy and epileptic syndromes with complex partial seizures, without mention of intractable epilepsy 01/17/2013  . Intermittent explosive disorder 01/17/2013  . Nocturnal enuresis 01/17/2013  . Autistic disorder, current or active state 01/17/2013  . 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  . Leukocytopenia, unspecified 01/17/2013  . Encounter for long-term (current)  use of other medications 01/17/2013  . Autism 06/17/2012  . Seizures (HCC) 06/17/2012    Past Medical History:  Diagnosis Date  . Autism   . Intermittent explosive disorder   . Seizures (HCC)     Past Surgical History:  Procedure Laterality Date  . arm fracture     due to seizures  . brain surgery    . broken leg    . CRANIOTOMY FOR SUBDURAL IMPLANTATION OF ELECTRODE ARRAY    . OTHER SURGICAL HISTORY     Bilateral Frontal Sub- Pial Resections    Social History   Social History  . Marital status: Single    Spouse name: N/A  . Number of children: N/A  . Years of education: N/A   Occupational History  . Not on file.   Social History Main Topics  . Smoking status: Never Smoker  . Smokeless tobacco: Never Used  . Alcohol use No  . Drug use: No  . Sexual activity: No   Other Topics Concern  . Not on file   Social History Narrative  . No narrative on file    Family History  Problem Relation Age of Onset  . Migraines Mother        Intractable Migraines  . Hypothyroidism Mother   . Stroke Maternal Grandfather        Died at 5283  . Alcohol abuse Neg Hx      Review of Systems  Unable to perform ROS: Other  Vitals:   04/07/17 1214  BP: 117/76  Pulse: 93  Resp: 18  Temp: 98 F (36.7 C)     Physical Exam  Constitutional: He is oriented to person, place, and time. He appears well-developed and well-nourished.  HENT:  Head: Normocephalic.  Eyes: Pupils are equal, round, and reactive to light. Conjunctivae and EOM are normal.  Neck: Normal range of motion.  Cardiovascular: Normal rate.   Pulmonary/Chest: Effort normal.  Neurological: He is alert and oriented to person, place, and time.  Skin: Skin is warm and dry. Capillary refill takes less than 2 seconds.  +laceration to outer right eyebrow, irregular 3 cm long.  Psychiatric: His behavior is normal.  Vitals reviewed.  Procedure Note: Lido with Epi injected; irrigated with NSS; no debri noted;  closed with 5 5-0 Nylon sutures. Tolerated procedure well. Father at bedside.   ASSESSMENT & PLAN: Roy Price was seen today for fall.  Diagnoses and all orders for this visit:  Facial laceration, initial encounter  Autism  Seizures [facility](HCC)     Patient Instructions       IF you received an x-ray today, you will receive an invoice from [facility]. Please contact [facility] at [phone] with questions or concerns regarding your invoice.   IF you received labwork today, you will receive an invoice from [facility]. Please contact [facility] at [phone] with questions or concerns regarding your invoice.   Our billing staff will not be able to assist you with questions regarding bills from these companies.  You will be contacted with the lab results as soon as they are available. The fastest way to get your results is to activate your My Chart account. Instructions are located on the last page of this paperwork. If you have not heard from us regarding the results in 2 weeks, please contact this office.      Laceration Care, Adult A laceration is a cut that goes through all layers of the skin. The cut also goes into the tissue that is right under the skin. Some cuts heal on their own. Others need to be closed with stitches (sutures), staples, skin adhesive strips, or wound glue. Taking care of your cut lowers your risk of infection and helps your cut to heal better. How to take care of your cut For stitches or staples:  Keep the wound clean and dry.  If you were given a bandage (dressing), you should change it at least one time per day or as told by your doctor. You should also change it if it gets wet or dirty.  Keep the wound completely dry for the first 24 hours or as told by your doctor. After that time, you may take a shower or a bath. However, make sure that the wound is not soaked in water until after the stitches or staples have been removed.  Clean the  wound one time each day or as told by your doctor: ? Wash the wound with soap and water. ? Rinse the wound with water until all of the soap comes off. ? Pat the wound dry with a clean towel. Do not rub the wound.  After you clean the wound, put a thin layer of antibiotic ointment on it as told by your doctor. This ointment: ? Helps to prevent infection. ? Keeps the bandage from sticking to the wound.  Have your stitches or staples removed as told by your doctor. If your doctor used skin adhesive strips:  Keep the wound clean and  dry.  If you were given a bandage, you should change it at least one time per day or as told by your doctor. You should also change it if it gets dirty or wet.  Do not get the skin adhesive strips wet. You can take a shower or a bath, but be careful to keep the wound dry.  If the wound gets wet, pat it dry with a clean towel. Do not rub the wound.  Skin adhesive strips fall off on their own. You can trim the strips as the wound heals. Do not remove any strips that are still stuck to the wound. They will fall off after a while. If your doctor used wound glue:  Try to keep your wound dry, but you may briefly wet it in the shower or bath. Do not soak the wound in water, such as by swimming.  After you take a shower or a bath, gently pat the wound dry with a clean towel. Do not rub the wound.  Do not do any activities that will make you really sweaty until the skin glue has fallen off on its own.  Do not apply liquid, cream, or ointment medicine to your wound while the skin glue is still on.  If you were given a bandage, you should change it at least one time per day or as told by your doctor. You should also change it if it gets dirty or wet.  If a bandage is placed over the wound, do not let the tape for the bandage touch the skin glue.  Do not pick at the glue. The skin glue usually stays on for 5-10 days. Then, it falls off of the skin. General  Instructions  To help prevent scarring, make sure to cover your wound with sunscreen whenever you are outside after stitches are removed, after adhesive strips are removed, or when wound glue stays in place and the wound is healed. Make sure to wear a sunscreen of at least 30 SPF.  Take over-the-counter and prescription medicines only as told by your doctor.  If you were given antibiotic medicine or ointment, take or apply it as told by your doctor. Do not stop using the antibiotic even if your wound is getting better.  Do not scratch or pick at the wound.  Keep all follow-up visits as told by your doctor. This is important.  Check your wound every day for signs of infection. Watch for: ? Redness, swelling, or pain. ? Fluid, blood, or pus.  Raise (elevate) the injured area above the level of your heart while you are sitting or lying down, if possible. Get help if:  You got a tetanus shot and you have any of these problems at the injection site: ? Swelling. ? Very bad pain. ? Redness. ? Bleeding.  You have a fever.  A wound that was closed breaks open.  You notice a bad smell coming from your wound or your bandage.  You notice something coming out of the wound, such as wood or glass.  Medicine does not help your pain.  You have more redness, swelling, or pain at the site of your wound.  You have fluid, blood, or pus coming from your wound.  You notice a change in the color of your skin near your wound.  You need to change the bandage often because fluid, blood, or pus is coming from the wound.  You start to have a new rash.  You start to have numbness  around the wound. Get help right away if:  You have very bad swelling around the wound.  Your pain suddenly gets worse and is very bad.  You notice painful lumps near the wound or on skin that is anywhere on your body.  You have a red streak going away from your wound.  The wound is on your hand or foot and you  cannot move a finger or toe like you usually can.  The wound is on your hand or foot and you notice that your fingers or toes look pale or bluish. This information is not intended to replace advice given to you by your health care provider. Make sure you discuss any questions you have with your health care provider. Document Released: 02/07/2008 Document Revised: 01/27/2016 Document Reviewed: 08/17/2014 Elsevier Interactive Patient Education  2018 Elsevier Inc.    Edwina Barth, MD [facility]

## 2017-06-15 ENCOUNTER — Encounter (HOSPITAL_COMMUNITY): Payer: Self-pay

## 2017-06-15 ENCOUNTER — Observation Stay (HOSPITAL_COMMUNITY)
Admission: EM | Admit: 2017-06-15 | Discharge: 2017-06-16 | DRG: 083 | Disposition: A | Discharge status: Home / Self Care | Payer: BLUE CROSS/BLUE SHIELD | Attending: Neurological Surgery | Admitting: Neurological Surgery

## 2017-06-15 ENCOUNTER — Emergency Department (HOSPITAL_COMMUNITY): Payer: BLUE CROSS/BLUE SHIELD

## 2017-06-15 DIAGNOSIS — W19XXXA Unspecified fall, initial encounter: Secondary | ICD-10-CM | POA: Diagnosis not present

## 2017-06-15 DIAGNOSIS — G40909 Epilepsy, unspecified, not intractable, without status epilepticus: Secondary | ICD-10-CM | POA: Diagnosis present

## 2017-06-15 DIAGNOSIS — I609 Nontraumatic subarachnoid hemorrhage, unspecified: Secondary | ICD-10-CM

## 2017-06-15 DIAGNOSIS — S065X9A Traumatic subdural hemorrhage with loss of consciousness of unspecified duration, initial encounter: Secondary | ICD-10-CM | POA: Diagnosis present

## 2017-06-15 DIAGNOSIS — S065XAA Traumatic subdural hemorrhage with loss of consciousness status unknown, initial encounter: Secondary | ICD-10-CM

## 2017-06-15 DIAGNOSIS — F840 Autistic disorder: Secondary | ICD-10-CM | POA: Diagnosis present

## 2017-06-15 DIAGNOSIS — S066X9A Traumatic subarachnoid hemorrhage with loss of consciousness of unspecified duration, initial encounter: Secondary | ICD-10-CM | POA: Diagnosis present

## 2017-06-15 DIAGNOSIS — S0990XA Unspecified injury of head, initial encounter: Secondary | ICD-10-CM | POA: Diagnosis present

## 2017-06-15 MED ORDER — LORAZEPAM 2 MG/ML IJ SOLN: 0.5000 mg | INTRAMUSCULAR | Status: DC | PRN

## 2017-06-15 MED ORDER — LACOSAMIDE 50 MG PO TABS: 50.0000 mg | ORAL_TABLET | Freq: Two times a day (BID) | ORAL | Status: DC

## 2017-06-15 MED ORDER — VITAMIN D 1000 UNITS PO TABS
2000.0000 [IU] | ORAL_TABLET | Freq: Every day | ORAL | Status: DC
Start: 2017-06-15 — End: 2017-06-16

## 2017-06-15 MED ORDER — VITAMIN E 180 MG (400 UNIT) PO CAPS
400.0000 [IU] | ORAL_CAPSULE | Freq: Every day | ORAL | Status: DC
  Filled 2017-06-15: qty 1

## 2017-06-15 MED ORDER — ACETAMINOPHEN 325 MG PO TABS
650.0000 mg | ORAL_TABLET | ORAL | Status: DC | PRN
  Administered 2017-06-16: 650 mg via ORAL
  Filled 2017-06-15: qty 2

## 2017-06-15 MED ORDER — SODIUM CHLORIDE 0.9 % IV SOLN
INTRAVENOUS | Status: DC
  Filled 2017-06-15: qty 1000

## 2017-06-15 MED ORDER — LACOSAMIDE 50 MG PO TABS: 250.0000 mg | ORAL_TABLET | Freq: Every day | ORAL | Status: DC

## 2017-06-15 MED ORDER — ACETAMINOPHEN 325 MG PO TABS
650.0000 mg | ORAL_TABLET | Freq: Once | ORAL | Status: DC
  Filled 2017-06-15: qty 2

## 2017-06-15 MED ORDER — LACOSAMIDE 200 MG PO TABS
300.0000 mg | ORAL_TABLET | Freq: Every morning | ORAL | Status: DC
  Administered 2017-06-16: 300 mg via ORAL
  Filled 2017-06-15: qty 2

## 2017-06-15 MED ORDER — COENZYME Q10 30 MG PO CAPS
30.0000 mg | ORAL_CAPSULE | Freq: Every day | ORAL | Status: DC
Start: 2017-06-15 — End: 2017-06-15

## 2017-06-15 MED ORDER — VITAMIN C 500 MG PO TABS: 500.0000 mg | ORAL_TABLET | Freq: Every day | ORAL | Status: DC

## 2017-06-15 MED ORDER — ONDANSETRON HCL 4 MG PO TABS: 4.0000 mg | ORAL_TABLET | Freq: Four times a day (QID) | ORAL | Status: DC | PRN

## 2017-06-15 MED ORDER — ONDANSETRON HCL 4 MG/2ML IJ SOLN: 4.0000 mg | Freq: Four times a day (QID) | INTRAMUSCULAR | Status: DC | PRN

## 2017-06-15 NOTE — H&P (Signed)
Subjective:   Patient is a 21 y.o. male seen regarding a fall post seizure. He has a history of epilepsy and autism. Reported by mother that patient fell after a seizure yesterday afternoon landing on his head. Since then he has had 2 seizures and multiple episodes of vomiting. Patients family states that he was acting different since his fall. On multiple seizure medications at home. Physical exam difficult to obtain with his history of autism.   Past Medical History:  Diagnosis Date  . Autism   . Intermittent explosive disorder   . Seizures (HCC)     Past Surgical History:  Procedure Laterality Date  . arm fracture     due to seizures  . brain surgery    . broken leg    . CRANIOTOMY FOR SUBDURAL IMPLANTATION OF ELECTRODE ARRAY    . OTHER SURGICAL HISTORY     Bilateral Frontal Sub- Pial Resections    Allergies  Allergen Reactions  . Dairy Aid [Lactase] Rash    Opioid effect     Social History  Substance Use Topics  . Smoking status: Never Smoker  . Smokeless tobacco: Never Used  . Alcohol use No    Family History  Problem Relation Age of Onset  . Migraines Mother        Intractable Migraines  . Hypothyroidism Mother   . Stroke Maternal Grandfather        Died at 60  . Alcohol abuse Neg Hx    Prior to Admission medications   Medication Sig Start Date End Date Taking? Authorizing Provider  cholecalciferol (VITAMIN D) 400 UNITS TABS Take 2,000 Units by mouth at bedtime.    Yes [provider]  co-enzyme Q-10 30 MG capsule Take 30 mg by mouth at bedtime.    Yes [provider]  lacosamide (VIMPAT) 50 MG TABS TAKE 2 AND 1/2 TABLETS BY MOUTH TWICE DAILY Patient taking differently: 300 MG IN THE MORNING AND 250 MG IN THE EVENING 02/17/13  Yes Goodpasture, Inetta Fermo, NP  Omega-3 Fatty Acids (FISH OIL PO) Take 1 capsule by mouth daily. gummies    Yes [provider]  vitamin E 400 UNIT capsule Take 400 Units by mouth daily.   Yes [provider]   PREVIDENT 5000 BOOSTER PLUS 1.1 % PSTE 1 application See admin instructions. Daily as directed 05/23/16   [provider]  vitamin C (ASCORBIC ACID) 500 MG tablet Take 500 mg by mouth daily.    [provider]     Review of Systems  Positive ROS:   All other systems have been reviewed and were otherwise negative with the exception of those mentioned in the HPI and as above.  Objective: Vital signs in last 24 hours: Temp:  [97.9 F (36.6 C)] 97.9 F (36.6 C) (10/12 1435) Pulse Rate:  [84-100] 84 (10/12 1730) Resp:  [17-18] 18 (10/12 1730) BP: (116-126)/(73-74) 126/74 (10/12 1730) SpO2:  [100 %] 100 % (10/12 1730) Weight:  [52.2 kg (115 lb)] 52.2 kg (115 lb) (10/12 1435)  General Appearance: Alert, cooperative, no distress, appears stated age Head: Normocephalic, without obvious abnormality, atraumatic Eyes: PERRL, conjunctiva/corneas clear, EOM's intact, fundi benign, both eyes      Ears: Not tested Lungs:  respirations unlabored Heart: Regular rate and rhythm Extremities: Extremities normal, atraumatic, no cyanosis or edema Pulses: 2+ and symmetric all extremities Skin: Skin color, texture, turgor normal, no rashes or lesions  NEUROLOGIC:   Mental status:UTA with history  Motor Exam -  grossly normal Sensory Exam - Reflexes: Coordination - grossly normal Gait -not tested Balance - not tested Cranial Nerves: I: smell Not tested  II: visual acuity  OS: NA OD: NA  II: visual fields Full to confrontation  II: pupils Equal, round, reactive to light  III,VII: ptosis None  III,IV,VI: extraocular muscles  Full ROM  V: mastication Not tested  V: facial light touch sensation  Not tested  V,VII: corneal reflex  Not tested  VII: facial muscle function - upper  Not tested  VII: facial muscle function - lower Not tested  VIII: hearing Not tested  IX: soft palate elevation  Normal  IX,X: gag reflex Present  XI: trapezius strength    XI: sternocleidomastoid  strength   XI: neck flexion strength    XII: tongue strength      Data Review Lab Results  Component Value Date   WBC 9.0 11/02/2008   HGB 13.8 11/02/2008   HCT 40.5 11/02/2008   MCV 99.5 (H) 11/02/2008   PLT 160 11/02/2008   Lab Results  Component Value Date   NA 140 11/02/2008   K 4.3 11/02/2008   CL 104 11/02/2008   CO2 30 11/02/2008   BUN 11 11/02/2008   CREATININE 0.38 (L) 11/02/2008   GLUCOSE 114 (H) 11/02/2008   No results found for: INR, PROTIME  Assessment/Plan: 21 year old male comes in today with a history of autism and seizures. Status post fall yesterday after seizing. Had multiple episodes of vomiting. CT shows bilateral frontal contusions and SAH and left parietal subdural hematoma 5mm. Will plan to admit to ICU overnight. Possible transfer to the floor tomorrow pending his status. Will rescan in the morning.    Tiana Loft [facility] 06/15/2017 7:48 PM

## 2017-06-15 NOTE — ED Triage Notes (Signed)
Patient had seizures x 2 with vomiting afterwards last night. Today, the family reports vertigo, patient also points to the right side of his head indicating pain. Patient is autistic.

## 2017-06-15 NOTE — Progress Notes (Signed)
Patient admitted from [facility]. Patient alert to self. Patient made comfortable. Parents at the bed side.

## 2017-06-15 NOTE — ED Notes (Signed)
Bed: WA07 Expected date:  Expected time:  Means of arrival:  Comments: Triage 4 

## 2017-06-15 NOTE — ED Notes (Signed)
The Mother refused to allow an IV to be placed. She sts she wants him to rest and he fights a lot when it has to be done.

## 2017-06-15 NOTE — ED Provider Notes (Signed)
[facility]-EMERGENCY DEPT Provider Note   CSN: [id number] Arrival date & time: 06/15/17  1427     History   Chief Complaint Chief Complaint  Patient presents with  . Seizures  . Dizziness    HPI Roy Price is a 21 y.o. male.  HPI  Patient presents with his parents of systems the history of present illness. Patient has autism, level V caveat secondary to cognitive status. Family notes that the patient has seizures, frequent falls secondary to seizure. Yesterday the patient had a fall, striking the top of his headAfterwards the patient begin point to his head, as though it hurt, and had a brief period of decreased interactivity, but then seemingly improved. Patient had 2 episodes of vomiting in the evening, but remained awake and alert, interacting in a typical, slightly less interactive manner. With persistent decreased interactivity today, and patient continuing to point and his head, he is brought for evaluation. Patient has notable history of 2 prior intra-cranial surgery, and the patient's father is a Midwife.  Past Medical History:  Diagnosis Date  . Autism   . Intermittent explosive disorder   . Seizures [facility])     Patient Active Problem List   Diagnosis Date Noted  . Facial laceration 04/07/2017  . 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 Localization-related (focal) (partial) epilepsy and epileptic syndromes with complex partial seizures, without mention of intractable epilepsy 01/17/2013  . Intermittent explosive disorder 01/17/2013  . Nocturnal enuresis 01/17/2013  . Autistic disorder, current or active state 01/17/2013  . 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  . Leukocytopenia, unspecified 01/17/2013  . Encounter for long-term (current) use of other medications 01/17/2013  . Autism 06/17/2012  . Seizures (HCC) 06/17/2012    Past Surgical History:  Procedure Laterality Date  . arm fracture     due to seizures    . brain surgery    . broken leg    . CRANIOTOMY FOR SUBDURAL IMPLANTATION OF ELECTRODE ARRAY    . OTHER SURGICAL HISTORY     Bilateral Frontal Sub- Pial Resections       Home Medications    Prior to Admission medications   Medication Sig Start Date End Date Taking? Authorizing Provider  cholecalciferol (VITAMIN D) 400 UNITS TABS Take 2,000 Units by mouth at bedtime.     [provider]  co-enzyme Q-10 30 MG capsule Take 30 mg by mouth at bedtime.     [provider]  Lacosamide (VIMPAT) 100 MG TABS Take 300 mg by mouth 2 (two) times daily.    [provider]  lacosamide (VIMPAT) 50 MG TABS TAKE 2 AND 1/2 TABLETS BY MOUTH TWICE DAILY 02/17/13   Elveria Rising, NP  Omega-3 Fatty Acids (FISH OIL PO) Take 1 capsule by mouth daily. gummies     [provider]  PREVIDENT 5000 BOOSTER PLUS 1.1 % PSTE 1 application See admin instructions. Daily as directed 05/23/16   [provider]  vitamin C (ASCORBIC ACID) 500 MG tablet Take 500 mg by mouth daily.    [provider]  vitamin E 400 UNIT capsule Take 400 Units by mouth daily.    [provider]    Family History Family History  Problem Relation Age of Onset  . Migraines Mother        Intractable Migraines  . Hypothyroidism Mother   . Stroke Maternal Grandfather        Died at 9  . Alcohol abuse Neg Hx  Social History Social History  Substance Use Topics  . Smoking status: Never Smoker  . Smokeless tobacco: Never Used  . Alcohol use No     Allergies   Dairy aid [lactase]   Review of Systems Review of Systems  Unable to perform ROS: Other  autism, baseline minimal interactivity  Physical Exam Updated Vital Signs BP 126/74 (BP Location: Left Arm)   Pulse 84   Temp 97.9 F (36.6 C) (Oral)   Resp 18   Ht  (1.778 m)   Wt 52.2 kg (115 lb)   SpO2 100%   BMI 16.50 kg/m   Physical Exam  Constitutional: He is oriented to person, place, and  time. No distress.  Thin young male sitting upright answer questions briefly, consistently, with pointing.  HENT:  Head: Normocephalic and atraumatic.  Eyes: Conjunctivae and EOM are normal.  Cardiovascular: Normal rate and regular rhythm.   Pulmonary/Chest: Effort normal. No stridor. No respiratory distress.  Abdominal: He exhibits no distension.  Musculoskeletal: He exhibits no edema.  Neurological: He is alert and oriented to person, place, and time.  Patient's baseline condition, locates narrowing though somewhat, but patient does not have gross facial asymmetry, speech seems consistent with baseline, according to family members, and he moves all extremity spontaneously.   Skin: Skin is warm and dry.  Psychiatric: He has a normal mood and affect.  Nursing note and vitals reviewed.    ED Treatments / Results   Radiology Ct Head Wo Contrast  Result Date: 06/15/2017 CLINICAL DATA:  Seizure x2 with vomiting afterward.  Vertigo. EXAM: CT HEAD WITHOUT CONTRAST TECHNIQUE: Contiguous axial images were obtained from the base of the skull through the vertex without intravenous contrast. COMPARISON:  01/12/2017 FINDINGS: Brain: Moderate bilateral inferior frontal edematous superficial parenchyma with superimposed subarachnoid and probable small volume parenchymal hemorrhage. Same findings to a milder extent seen in the lateral and posterior left frontal region. Limited left parietal subdural hemorrhage measuring up to 5 mm in thickness. No midline shift. Mild cerebral atrophy. Vascular: No hyperdense vessel or unexpected calcification. Skull: Postoperative calvarium reportedly for surface electrode implantation. No acute finding Sinuses/Orbits: Negative Other: Critical Value/emergent results were called by telephone at the time of interpretation on 06/15/2017 at 5:06 pm to Dr. Jeraldine Loots, who verbally acknowledged these results. IMPRESSION: 1. Bilateral inferior frontal and left posterior frontal  cerebral contusion with local subarachnoid hemorrhage. 2. Left parietal subdural hemorrhage only measuring 5 mm in thickness. Electronically Signed   By: Marnee Spring M.D.   On: 06/15/2017 17:12    Procedures Procedures (including critical care time)    Initial Impression / Assessment and Plan / ED Course  I have reviewed the triage vital signs and the nursing notes.  Pertinent labs & imaging results that were available during my care of the patient were reviewed by me and considered in my medical decision making (see chart for details).  I received a call from our radiologist given concern for intracranial hemorrhage. I discussed this with the patient's father, and we reviewed the images together.  6:17 PM I discussed the patient's studies with our neurosurgeon on-call and the patient will be transferred to our neuro ICU at our affiliated facility. Repeat exam the patient is in similar condition. With his ongoing pain he will receive Tylenol. Family is aware of all findings, transfer.   Final Clinical Impressions(s) / ED Diagnoses  Fall, initial encounter Subarachnoid hemorrhage Subdural hemorrhage   CRITICAL CARE Performed by: Gerhard Munch Total critical care  time: 35 minutes Critical care time was exclusive of separately billable procedures and treating other patients. Critical care was necessary to treat or prevent imminent or life-threatening deterioration. Critical care was time spent personally by me on the following activities: development of treatment plan with patient and/or surrogate as well as nursing, discussions with consultants, evaluation of patient's response to treatment, examination of patient, obtaining history from patient or surrogate, ordering and performing treatments and interventions, ordering and review of laboratory studies, ordering and review of radiographic studies, pulse oximetry and re-evaluation of patient's condition.    Gerhard Munch,  MD 06/15/17 [phone]

## 2017-06-15 NOTE — Progress Notes (Signed)
PHARMACIST - PHYSICIAN ORDER COMMUNICATION  CONCERNING: P&T Medication Policy on Herbal Medications  DESCRIPTION:  This patient's order for:  Co Enzyme Q10  has been noted.  This product(s) is classified as an "herbal" or natural product. Due to a lack of definitive safety studies or FDA approval, nonstandard manufacturing practices, plus the potential risk of unknown drug-drug interactions while on inpatient medications, the Pharmacy and Therapeutics Committee does not permit the use of "herbal" or natural products of this type within [place].   ACTION TAKEN: The pharmacy department is unable to verify this order at this time and your patient has been informed of this safety policy. Please reevaluate patient's clinical condition at discharge and address if the herbal or natural product(s) should be resumed at that time.   

## 2017-06-16 ENCOUNTER — Inpatient Hospital Stay (HOSPITAL_COMMUNITY): Payer: BLUE CROSS/BLUE SHIELD

## 2017-06-16 LAB — MRSA PCR SCREENING: MRSA BY PCR: NEGATIVE

## 2017-06-16 MED ORDER — LACOSAMIDE 50 MG PO TABS: 250.0000 mg | ORAL_TABLET | Freq: Every day | ORAL | Status: DC

## 2017-06-16 MED ORDER — LACOSAMIDE 50 MG PO TABS: 300.0000 mg | ORAL_TABLET | Freq: Every day | ORAL | Status: DC

## 2017-06-16 NOTE — Progress Notes (Signed)
Discharge paperwork given to father, all questions answered. Patient IV removed. Patient taken out via wheelchair in no distress.

## 2017-06-16 NOTE — Discharge Summary (Signed)
Physician Discharge Summary  Patient ID: Roy Price MRN: [id number] DOB/AGE: November 24, 1995 21 y.o.  Admit date: 06/15/2017 Discharge date: 06/16/2017  Admission Diagnoses: tSAH and SDH     Discharge Diagnoses: same as admitting   Discharged Condition: good  Hospital Course: The patient was admitted on 06/15/2017 and taken to the ICU  in stable condition. The hospital course was routine. There were no complications. Pt had appropriate head soreness. No complaints of neck pain or new N/T/W. The patient remained afebrile with stable vital signs, and tolerated a regular diet. The patient continued to increase activities, and pain was well controlled with oral pain medications.   Consults: None  Significant Diagnostic Studies:  Results for orders placed or performed during the hospital encounter of 06/15/17  MRSA PCR Screening  Result Value Ref Range   MRSA by PCR NEGATIVE NEGATIVE    Ct Head Wo Contrast  Result Date: 06/16/2017 CLINICAL DATA:  Followup intracranial hemorrhage. EXAM: CT HEAD WITHOUT CONTRAST TECHNIQUE: Contiguous axial images were obtained from the base of the skull through the vertex without intravenous contrast. COMPARISON:  Yesterday FINDINGS: Brain: Right more than left inferior frontal contusion and local subarachnoid hemorrhage. Lateral left frontal small contusion and local subarachnoid hemorrhage. No progressive swelling or progressive hemorrhage. A left parietal subdural hemorrhage is unchanged at 5 mm in thickness. No infarct or hydrocephalus. No midline shift or herniation. Vascular: Negative Skull: Swelling at the vertex. Postoperative calvarium without acute finding. Sinuses/Orbits: Nonspecific gaze to the left. IMPRESSION: Stable cerebral contusions, subarachnoid hemorrhage, and thin left parietal subdural hematoma. Electronically Signed   By: Marnee Spring M.D.   On: 06/16/2017 06:59   Ct Head Wo Contrast  Result Date: 06/15/2017 CLINICAL DATA:   Seizure x2 with vomiting afterward.  Vertigo. EXAM: CT HEAD WITHOUT CONTRAST TECHNIQUE: Contiguous axial images were obtained from the base of the skull through the vertex without intravenous contrast. COMPARISON:  01/12/2017 FINDINGS: Brain: Moderate bilateral inferior frontal edematous superficial parenchyma with superimposed subarachnoid and probable small volume parenchymal hemorrhage. Same findings to a milder extent seen in the lateral and posterior left frontal region. Limited left parietal subdural hemorrhage measuring up to 5 mm in thickness. No midline shift. Mild cerebral atrophy. Vascular: No hyperdense vessel or unexpected calcification. Skull: Postoperative calvarium reportedly for surface electrode implantation. No acute finding Sinuses/Orbits: Negative Other: Critical Value/emergent results were called by telephone at the time of interpretation on 06/15/2017 at 5:06 pm to Dr. Jeraldine Loots, who verbally acknowledged these results. IMPRESSION: 1. Bilateral inferior frontal and left posterior frontal cerebral contusion with local subarachnoid hemorrhage. 2. Left parietal subdural hemorrhage only measuring 5 mm in thickness. Electronically Signed   By: Marnee Spring M.D.   On: 06/15/2017 17:12    Antibiotics:  Anti-infectives    None      Discharge Exam: Blood pressure 118/78, pulse 65, temperature 98.4 F (36.9 C), temperature source Oral, resp. rate 16, height  (1.778 m), weight 51.4 kg (113 lb 5.1 oz), SpO2 98 %. Neurologic: Grossly normal At baseline for him  Discharge Medications:   Allergies as of 06/16/2017      Reactions   Dairy Aid [lactase] Rash   Opioid effect       Medication List    TAKE these medications   cholecalciferol 400 units Tabs tablet Commonly known as:  VITAMIN D Take 2,000 Units by mouth at bedtime.   co-enzyme Q-10 30 MG capsule Take 30 mg by mouth at bedtime.   FISH OIL PO  Take 1 capsule by mouth daily. gummies   lacosamide 50 MG Tabs  tablet Commonly known as:  VIMPAT TAKE 2 AND 1/2 TABLETS BY MOUTH TWICE DAILY What changed:  additional instructions   PREVIDENT 5000 BOOSTER PLUS 1.1 % Pste Generic drug:  Sodium Fluoride 1 application See admin instructions. Daily as directed   vitamin C 500 MG tablet Commonly known as:  ASCORBIC ACID Take 500 mg by mouth daily.   vitamin E 400 UNIT capsule Take 400 Units by mouth daily.       Disposition: home   Final Dx: same as admitting  Discharge Instructions    Call MD for:  difficulty breathing, headache or visual disturbances    Complete by:  As directed    Call MD for:  persistant nausea and vomiting    Complete by:  As directed    Call MD for:  severe uncontrolled pain    Complete by:  As directed    Call MD for:  temperature >100.4    Complete by:  As directed    Diet - low sodium heart healthy    Complete by:  As directed    Increase activity slowly    Complete by:  As directed          Signed: Tiana Loft Coletta Lockner 06/16/2017, 10:57 AM

## 2017-06-16 NOTE — Progress Notes (Signed)
Subjective: Patient reports doing well. Denies any HA, N/VFamily not at bedside.  Objective: Vital signs in last 24 hours: Temp:  [97.9 F (36.6 C)-98.9 F (37.2 C)] 98.4 F (36.9 C) (10/13 0800) Pulse Rate:  [84-104] 95 (10/13 0400) Resp:  [15-21] 15 (10/13 0400) BP: (108-126)/(59-74) 123/68 (10/13 0400) SpO2:  [92 %-100 %] 97 % (10/13 0400) Weight:  [51.4 kg (113 lb 5.1 oz)-52.2 kg (115 lb)] 51.4 kg (113 lb 5.1 oz) (10/12 2200)  Intake/Output from previous day: No intake/output data recorded. Intake/Output this shift: No intake/output data recorded.  Neurologic: Grossly normal  Lab Results: Lab Results  Component Value Date   WBC 9.0 11/02/2008   HGB 13.8 11/02/2008   HCT 40.5 11/02/2008   MCV 99.5 (H) 11/02/2008   PLT 160 11/02/2008   No results found for: INR, PROTIME BMET Lab Results  Component Value Date   NA 140 11/02/2008   K 4.3 11/02/2008   CL 104 11/02/2008   CO2 30 11/02/2008   GLUCOSE 114 (H) 11/02/2008   BUN 11 11/02/2008   CREATININE 0.38 (L) 11/02/2008   CALCIUM 9.5 11/02/2008    Studies/Results: Ct Head Wo Contrast  Result Date: 06/16/2017 CLINICAL DATA:  Followup intracranial hemorrhage. EXAM: CT HEAD WITHOUT CONTRAST TECHNIQUE: Contiguous axial images were obtained from the base of the skull through the vertex without intravenous contrast. COMPARISON:  Yesterday FINDINGS: Brain: Right more than left inferior frontal contusion and local subarachnoid hemorrhage. Lateral left frontal small contusion and local subarachnoid hemorrhage. No progressive swelling or progressive hemorrhage. A left parietal subdural hemorrhage is unchanged at 5 mm in thickness. No infarct or hydrocephalus. No midline shift or herniation. Vascular: Negative Skull: Swelling at the vertex. Postoperative calvarium without acute finding. Sinuses/Orbits: Nonspecific gaze to the left. IMPRESSION: Stable cerebral contusions, subarachnoid hemorrhage, and thin left parietal subdural  hematoma. Electronically Signed   By: Marnee Spring M.D.   On: 06/16/2017 06:59   Ct Head Wo Contrast  Result Date: 06/15/2017 CLINICAL DATA:  Seizure x2 with vomiting afterward.  Vertigo. EXAM: CT HEAD WITHOUT CONTRAST TECHNIQUE: Contiguous axial images were obtained from the base of the skull through the vertex without intravenous contrast. COMPARISON:  01/12/2017 FINDINGS: Brain: Moderate bilateral inferior frontal edematous superficial parenchyma with superimposed subarachnoid and probable small volume parenchymal hemorrhage. Same findings to a milder extent seen in the lateral and posterior left frontal region. Limited left parietal subdural hemorrhage measuring up to 5 mm in thickness. No midline shift. Mild cerebral atrophy. Vascular: No hyperdense vessel or unexpected calcification. Skull: Postoperative calvarium reportedly for surface electrode implantation. No acute finding Sinuses/Orbits: Negative Other: Critical Value/emergent results were called by telephone at the time of interpretation on 06/15/2017 at 5:06 pm to Dr. Jeraldine Loots, who verbally acknowledged these results. IMPRESSION: 1. Bilateral inferior frontal and left posterior frontal cerebral contusion with local subarachnoid hemorrhage. 2. Left parietal subdural hemorrhage only measuring 5 mm in thickness. Electronically Signed   By: Marnee Spring M.D.   On: 06/15/2017 17:12    Assessment/Plan: 21 year old male admitted last night for tSAH and SDH. Doing fairly well. Will continue to monitor. Follow up CT this morning unchanged.   LOS: 1 day    Tiana Loft Art Levan 06/16/2017, 9:51 AM

## 2017-07-25 ENCOUNTER — Ambulatory Visit
Admission: RE | Admit: 2017-07-25 | Discharge: 2017-07-25 | Disposition: A | Discharge status: Home / Self Care | Payer: BLUE CROSS/BLUE SHIELD | Source: Ambulatory Visit | Attending: Neurosurgery | Admitting: Neurosurgery

## 2017-07-25 ENCOUNTER — Other Ambulatory Visit: Payer: Self-pay | Admitting: Neurosurgery

## 2017-07-25 DIAGNOSIS — S0990XD Unspecified injury of head, subsequent encounter: Secondary | ICD-10-CM

## 2017-08-17 ENCOUNTER — Other Ambulatory Visit: Payer: Self-pay

## 2017-08-17 ENCOUNTER — Emergency Department (HOSPITAL_COMMUNITY)
Admission: EM | Admit: 2017-08-17 | Discharge: 2017-08-17 | Disposition: A | Discharge status: Home / Self Care | Payer: BLUE CROSS/BLUE SHIELD | Attending: Emergency Medicine | Admitting: Emergency Medicine

## 2017-08-17 DIAGNOSIS — F840 Autistic disorder: Secondary | ICD-10-CM | POA: Diagnosis not present

## 2017-08-17 DIAGNOSIS — Z79899 Other long term (current) drug therapy: Secondary | ICD-10-CM | POA: Insufficient documentation

## 2017-08-17 DIAGNOSIS — Y92018 Other place in single-family (private) house as the place of occurrence of the external cause: Secondary | ICD-10-CM | POA: Insufficient documentation

## 2017-08-17 DIAGNOSIS — W19XXXA Unspecified fall, initial encounter: Secondary | ICD-10-CM | POA: Diagnosis not present

## 2017-08-17 DIAGNOSIS — R569 Unspecified convulsions: Secondary | ICD-10-CM | POA: Insufficient documentation

## 2017-08-17 DIAGNOSIS — G40909 Epilepsy, unspecified, not intractable, without status epilepticus: Secondary | ICD-10-CM | POA: Diagnosis not present

## 2017-08-17 DIAGNOSIS — Y9389 Activity, other specified: Secondary | ICD-10-CM | POA: Insufficient documentation

## 2017-08-17 DIAGNOSIS — Y999 Unspecified external cause status: Secondary | ICD-10-CM | POA: Insufficient documentation

## 2017-08-17 DIAGNOSIS — S0181XA Laceration without foreign body of other part of head, initial encounter: Secondary | ICD-10-CM | POA: Diagnosis not present

## 2017-08-17 DIAGNOSIS — S0993XA Unspecified injury of face, initial encounter: Secondary | ICD-10-CM | POA: Diagnosis present

## 2017-08-17 LAB — CBG MONITORING, ED: GLUCOSE-CAPILLARY: 99 mg/dL (ref 65–99)

## 2017-08-17 MED ORDER — LIDOCAINE-EPINEPHRINE (PF) 2 %-1:200000 IJ SOLN
10.0000 mL | Freq: Once | INTRAMUSCULAR | Status: AC
  Administered 2017-08-17: 10 mL
  Filled 2017-08-17: qty 20

## 2017-08-17 NOTE — Discharge Instructions (Signed)
Your stitches will dissolve and you do not need to have them removed.  Keep the area clean with mild soap and warm water.  Avoid the use of peroxide as this will cause skin breakdown and prolonged healing times.  You may use topical bacitracin or Neosporin to prevent infection.  Follow-up with your neurologist regarding your increased seizure frequency.  Return to the emergency department if symptoms worsen or changes in behavior or consciousness develop.

## 2017-08-17 NOTE — ED Provider Notes (Signed)
[facility]-EMERGENCY DEPT Provider Note   CSN: [id number] Arrival date & time: 08/17/17  2103    History   Chief Complaint Chief Complaint  Patient presents with  . Seizures  . Laceration    chin    HPI Roy Price is a 21 y.o. male.  Level 5 caveat applies secondary to decreased cognitive function; patient with history of autism, nonverbal at baseline  21 year old male presents to the emergency department by EMS for evaluation of seizure-like activity.  Patient with history of seizures for which he is on Vimpat.  He is [facility]y [facility].  Sister reports compliance with current medication regimen, though chart review indicates history of seizures on a weekly basis.  Family reports that seizures lasted 30 seconds each.  They were consistent with prior seizure-like activity except for an episode of encopresis.  During the second seizure, patient fell forward from standing striking his chin on the hardwood floor.  He presents with a laceration today.  Family states that patient is currently acting at baseline.  He does have a history of subdural hematoma secondary to head trauma from seizure activity.  He has not had any nausea or vomiting since the incident.  No recent fevers.  Last Tdap unknown.      Past Medical History:  Diagnosis Date  . Autism   . Intermittent explosive disorder   . Seizures [facility](HCC)     Patient Active Problem List   Diagnosis Date Noted  . Closed head injury 06/15/2017  . Facial laceration 04/07/2017  . 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 Localization-related (focal) (partial) epilepsy and epileptic syndromes with complex partial seizures, without mention of intractable epilepsy 01/17/2013  . Intermittent explosive disorder 01/17/2013  . Nocturnal enuresis 01/17/2013  . Autistic disorder, current or active state 01/17/2013  . 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  .  Leukocytopenia, unspecified 01/17/2013  . Encounter for long-term (current) use of other medications 01/17/2013  . Autism 06/17/2012  . Seizures (HCC) 06/17/2012    Past Surgical History:  Procedure Laterality Date  . arm fracture     due to seizures  . brain surgery    . broken leg    . CRANIOTOMY FOR SUBDURAL IMPLANTATION OF ELECTRODE ARRAY    . OTHER SURGICAL HISTORY     Bilateral Frontal Sub- Pial Resections       Home Medications    Prior to Admission medications   Medication Sig Start Date End Date Taking? Authorizing Provider  budesonide (PULMICORT) 0.5 MG/2ML nebulizer solution Inhale 2 mLs into the lungs 2 (two) times daily. 07/18/17  Yes [provider]  cholecalciferol (VITAMIN D) 400 UNITS TABS Take 2,000 Units by mouth at bedtime.    Yes [provider]  co-enzyme Q-10 30 MG capsule Take 30 mg by mouth at bedtime.    Yes [provider]  lacosamide (VIMPAT) 50 MG TABS TAKE 2 AND 1/2 TABLETS BY MOUTH TWICE DAILY Patient taking differently: 300 MG IN THE MORNING AND 250 MG IN THE EVENING 02/17/13  Yes Goodpasture, Inetta Fermoina, NP  Omega-3 Fatty Acids (FISH OIL PO) Take 1 capsule by mouth daily. gummies    Yes [provider]  PREVIDENT 5000 BOOSTER PLUS 1.1 % PSTE 1 application See admin instructions. Daily as directed 05/23/16  Yes [provider]  vitamin C (ASCORBIC ACID) 500 MG tablet Take 500 mg by mouth daily.   Yes [provider]  vitamin E  400 UNIT capsule Take 400 Units by mouth daily.   Yes [provider]    Family History Family History  Problem Relation Age of Onset  . Migraines Mother        Intractable Migraines  . Hypothyroidism Mother   . Stroke Maternal Grandfather        Died at 58  . Alcohol abuse Neg Hx     Social History Social History   Tobacco Use  . Smoking status: Never Smoker  . Smokeless tobacco: Never Used  Substance Use Topics  . Alcohol use: No  . Drug use: No      Allergies   Dairy aid [lactase]   Review of Systems Review of Systems  Unable to perform ROS: Patient nonverbal     Physical Exam Updated Vital Signs BP 136/80   Pulse (!) 104   Temp (!) 97.4 F (36.3 C) (Oral) Comment: pt had mouth open slightly  Resp (!) 28   Ht 5\' 11"  (1.803 m)   Wt 51.3 kg (113 lb)   SpO2 98%   BMI 15.76 kg/m   Physical Exam  Constitutional: He appears well-developed and well-nourished. No distress.  Patient in NAD. Nontoxic appearing.  HENT:  Head: Normocephalic.  3.5cm laceration to underside of chin. Bleeding controlled.  Eyes: Conjunctivae and EOM are normal. No scleral icterus.  Neck: Normal range of motion.  Cardiovascular: Normal rate, regular rhythm and intact distal pulses.  Pulmonary/Chest: Effort normal. No respiratory distress.  Respirations even and unlabored  Musculoskeletal: Normal range of motion.  Neurological: He is alert. He exhibits normal muscle tone. Coordination normal.  Alert. No facial asymmetry. Patient appears to be moving all extremities. Will follow most commands. Nonverbal at baseline.  Skin: Skin is warm and dry. No rash noted. He is not diaphoretic. No erythema. No pallor.  Psychiatric: He has a normal mood and affect. His behavior is normal.  Nursing note and vitals reviewed.    ED Treatments / Results  Labs (all labs ordered are listed, but only abnormal results are displayed) Labs Reviewed  CBG MONITORING, ED    EKG  EKG Interpretation None       Radiology No results found.  Procedures Procedures (including critical care time) LACERATION REPAIR Performed by: Antony Madura Authorized by: Antony Madura Consent: Verbal consent obtained. Risks and benefits: risks, benefits and alternatives were discussed Consent given by: patient Patient identity confirmed: provided demographic data Prepped and Draped in normal sterile fashion Wound explored  Laceration Location: L chin  Laceration  Length: 3.5cm  No Foreign Bodies seen or palpated  Anesthesia: local infiltration  Local anesthetic: lidocaine 2% with epinephrine  Anesthetic total: 4 ml  Irrigation method: syringe Amount of cleaning: standard  Skin closure: 4-0 chromic  Number of sutures: 4  Technique: simple interrupted  Patient tolerance: Patient tolerated the procedure well with no immediate complications.   Medications Ordered in ED Medications  lidocaine-EPINEPHrine (XYLOCAINE W/EPI) 2 %-1:200000 (PF) injection 10 mL (10 mLs Infiltration Given by Other 08/17/17 2223)     Initial Impression / Assessment and Plan / ED Course  I have reviewed the triage vital signs and the nursing notes.  Pertinent labs & imaging results that were available during my care of the patient were reviewed by me and considered in my medical decision making (see chart for details).     21 year old male with a history of seizure disorder presents to the emergency department after 2 seizures at home.  Family reports that  patient has been acting at baseline since his seizure-like activity.  He tends to have seizures on a weekly basis, worse during weather changes.  Patient with no obvious focal deficits.  He is moving extremities spontaneously, following most commands.  He sustained a laceration to his chin which was repaired with chromic sutures without complications.  No other evidence of head injury or trauma.  Sister at this time does not believe the patient requires emergent imaging.  I do not believe this is unreasonable.  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  I have instructed to return to the emergency department for any new or worsening symptoms.  Return precautions discussed and provided. Patient discharged in stable condition with no unaddressed concerns.   Final Clinical Impressions(s) / ED Diagnoses   Final diagnoses:  Seizure [facility](HCC)   Facial laceration, initial encounter    ED Discharge Orders    None       Antony MaduraHumes, Javaeh Muscatello, PA-C 08/18/17 0501    Nira Connardama, Pedro Eduardo, MD 08/19/17 [phone]

## 2017-08-17 NOTE — ED Notes (Signed)
Bed: WA06 Expected date:  Expected time:  Means of arrival:  Comments: EMS [age] o male witnessed seizure-lac under chin/autistic

## 2017-08-17 NOTE — ED Triage Notes (Signed)
Pt arrived from home via [facility] with c/o of two seizures today. Pt is autistic with sister at the bedside and history of seizures. Both Seizures lasted 30 seconds, second one pt fell from standing face forward on a hardwood floor with a laceration to his chin. Pt became incontinent during last seizure which per family is not usual for him. Pt has hx of a subdural from seizing and falling backward in the past Vitals 132/95, 102 HR. 98% RA, 18 RR, 119 CBG

## 2017-09-18 ENCOUNTER — Ambulatory Visit
Admission: RE | Admit: 2017-09-18 | Discharge: 2017-09-18 | Disposition: A | Discharge status: Home / Self Care | Payer: BLUE CROSS/BLUE SHIELD | Source: Ambulatory Visit | Attending: Pediatrics | Admitting: Pediatrics

## 2017-09-18 ENCOUNTER — Other Ambulatory Visit: Payer: Self-pay | Admitting: Pediatrics

## 2017-09-18 DIAGNOSIS — R059 Cough, unspecified: Secondary | ICD-10-CM

## 2017-09-18 DIAGNOSIS — R05 Cough: Secondary | ICD-10-CM

## 2018-07-24 ENCOUNTER — Other Ambulatory Visit: Payer: Self-pay

## 2018-07-24 ENCOUNTER — Emergency Department (HOSPITAL_BASED_OUTPATIENT_CLINIC_OR_DEPARTMENT_OTHER): Payer: BLUE CROSS/BLUE SHIELD

## 2018-07-24 ENCOUNTER — Emergency Department (HOSPITAL_BASED_OUTPATIENT_CLINIC_OR_DEPARTMENT_OTHER)
Admission: EM | Admit: 2018-07-24 | Discharge: 2018-07-24 | Disposition: A | Discharge status: Home / Self Care | Payer: BLUE CROSS/BLUE SHIELD | Attending: Emergency Medicine | Admitting: Emergency Medicine

## 2018-07-24 ENCOUNTER — Encounter (HOSPITAL_BASED_OUTPATIENT_CLINIC_OR_DEPARTMENT_OTHER): Payer: Self-pay | Admitting: *Deleted

## 2018-07-24 DIAGNOSIS — Z79899 Other long term (current) drug therapy: Secondary | ICD-10-CM | POA: Insufficient documentation

## 2018-07-24 DIAGNOSIS — G40909 Epilepsy, unspecified, not intractable, without status epilepticus: Secondary | ICD-10-CM | POA: Insufficient documentation

## 2018-07-24 DIAGNOSIS — F840 Autistic disorder: Secondary | ICD-10-CM | POA: Diagnosis not present

## 2018-07-24 DIAGNOSIS — R1031 Right lower quadrant pain: Secondary | ICD-10-CM | POA: Diagnosis present

## 2018-07-24 DIAGNOSIS — N50819 Testicular pain, unspecified: Secondary | ICD-10-CM

## 2018-07-24 DIAGNOSIS — Z8659 Personal history of other mental and behavioral disorders: Secondary | ICD-10-CM

## 2018-07-24 DIAGNOSIS — K5641 Fecal impaction: Secondary | ICD-10-CM | POA: Diagnosis not present

## 2018-07-24 LAB — URINALYSIS, ROUTINE W REFLEX MICROSCOPIC
Bilirubin Urine: NEGATIVE
Glucose, UA: NEGATIVE mg/dL
Hgb urine dipstick: NEGATIVE
Ketones, ur: NEGATIVE mg/dL
LEUKOCYTES UA: NEGATIVE
Nitrite: NEGATIVE
PROTEIN: NEGATIVE mg/dL
SPECIFIC GRAVITY, URINE: 1.015 (ref 1.005–1.030)
pH: 6 (ref 5.0–8.0)

## 2018-07-24 LAB — COMPREHENSIVE METABOLIC PANEL
ALBUMIN: 4.6 g/dL (ref 3.5–5.0)
ALT: 16 U/L (ref 0–44)
ANION GAP: 13 (ref 5–15)
AST: 22 U/L (ref 15–41)
Alkaline Phosphatase: 82 U/L (ref 38–126)
BILIRUBIN TOTAL: 0.6 mg/dL (ref 0.3–1.2)
BUN: 13 mg/dL (ref 6–20)
CHLORIDE: 106 mmol/L (ref 98–111)
CO2: 20 mmol/L — ABNORMAL LOW (ref 22–32)
Calcium: 9.6 mg/dL (ref 8.9–10.3)
Creatinine, Ser: 0.59 mg/dL — ABNORMAL LOW (ref 0.61–1.24)
GFR calc Af Amer: >60 mL/min (ref >60)
Glucose, Bld: 98 mg/dL (ref 70–99)
POTASSIUM: 3.8 mmol/L (ref 3.5–5.1)
Sodium: 139 mmol/L (ref 135–145)
TOTAL PROTEIN: 8.1 g/dL (ref 6.5–8.1)

## 2018-07-24 LAB — CBC WITH DIFFERENTIAL/PLATELET
Abs Immature Granulocytes: 0.02 10*3/uL (ref 0.00–0.07)
BASOS ABS: 0 10*3/uL (ref 0.0–0.1)
BASOS PCT: 0 %
EOS ABS: 0 10*3/uL (ref 0.0–0.5)
EOS PCT: 0 %
HEMATOCRIT: 47.6 % (ref 39.0–52.0)
Hemoglobin: 15.9 g/dL (ref 13.0–17.0)
IMMATURE GRANULOCYTES: 0 %
Lymphocytes Relative: 24 %
Lymphs Abs: 1.8 10*3/uL (ref 0.7–4.0)
MCH: 32.4 pg (ref 26.0–34.0)
MCHC: 33.4 g/dL (ref 30.0–36.0)
MCV: 97.1 fL (ref 80.0–100.0)
Monocytes Absolute: 1 10*3/uL (ref 0.1–1.0)
Monocytes Relative: 13 %
Neutro Abs: 4.5 10*3/uL (ref 1.7–7.7)
Neutrophils Relative %: 63 %
PLATELETS: 212 10*3/uL (ref 150–400)
RBC: 4.9 MIL/uL (ref 4.22–5.81)
RDW: 11.4 % — AB (ref 11.5–15.5)
WBC: 7.3 10*3/uL (ref 4.0–10.5)
nRBC: 0 % (ref 0.0–0.2)

## 2018-07-24 MED ORDER — MORPHINE SULFATE (PF) 2 MG/ML IV SOLN
2.0000 mg | Freq: Once | INTRAVENOUS | Status: AC
  Administered 2018-07-24: 2 mg via INTRAVENOUS
  Filled 2018-07-24: qty 1

## 2018-07-24 MED ORDER — FLEET ENEMA 7-19 GM/118ML RE ENEM
1.0000 | ENEMA | Freq: Once | RECTAL | Status: AC
  Administered 2018-07-24: 1 via RECTAL
  Filled 2018-07-24: qty 1

## 2018-07-24 MED ORDER — IOPAMIDOL (ISOVUE-300) INJECTION 61%
100.0000 mL | Freq: Once | INTRAVENOUS | Status: AC | PRN
  Administered 2018-07-24: 100 mL via INTRAVENOUS

## 2018-07-24 NOTE — ED Provider Notes (Signed)
[facility] EMERGENCY DEPARTMENT Provider Note   CSN: [id number] Arrival date & time: 07/24/18  1544     History   Chief Complaint Chief Complaint  Patient presents with  . Abdominal Pain    HPI JUELL RADNEY is a 22 y.o. male.  Patient is a 22 year old male with history of autism, seizure disorder.  He is brought by mom for evaluation of possible abdominal pain.  For the past 2 days he has been having discomfort with movement and ambulation.  When asked where his pain is, he points to his right lower abdomen and groin.  Mom denies any fevers.  There have been no episodes of nausea or vomiting.  He has been having issues with constipation, however had a large bowel movement yesterday, however this did not relieve his symptoms.  Patient is mostly nonverbal and the vast majority of the history was taken from the mother who is present at bedside.     Past Medical History:  Diagnosis Date  . Autism   . Intermittent explosive disorder   . Seizures [facility])     Patient Active Problem List   Diagnosis Date Noted  . Closed head injury 06/15/2017  . Facial laceration 04/07/2017  . 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 Localization-related (focal) (partial) epilepsy and epileptic syndromes with complex partial seizures, without mention of intractable epilepsy 01/17/2013  . Intermittent explosive disorder 01/17/2013  . Nocturnal enuresis 01/17/2013  . Autistic disorder, current or active state 01/17/2013  . 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  . Leukocytopenia, unspecified 01/17/2013  . Encounter for long-term (current) use of other medications 01/17/2013  . Autism 06/17/2012  . Seizures (HCC) 06/17/2012    Past Surgical History:  Procedure Laterality Date  . arm fracture     due to seizures  . brain surgery    . broken leg    . CRANIOTOMY FOR SUBDURAL IMPLANTATION OF ELECTRODE ARRAY    . OTHER SURGICAL HISTORY     Bilateral Frontal Sub- Pial Resections        Home Medications    Prior to Admission medications   Medication Sig Start Date End Date Taking? Authorizing Provider  oseltamivir (TAMIFLU) 75 MG capsule Take 75 mg by mouth.   Yes [provider]  cholecalciferol (VITAMIN D) 400 UNITS TABS Take 2,000 Units by mouth at bedtime.     [provider]  co-enzyme Q-10 30 MG capsule Take 30 mg by mouth at bedtime.     [provider]  lacosamide (VIMPAT) 50 MG TABS TAKE 2 AND 1/2 TABLETS BY MOUTH TWICE DAILY Patient taking differently: 300 MG IN THE MORNING AND 250 MG IN THE EVENING 02/17/13   Elveria Rising, NP  Omega-3 Fatty Acids (FISH OIL PO) Take 1 capsule by mouth daily. gummies     [provider]  PREVIDENT 5000 BOOSTER PLUS 1.1 % PSTE 1 application See admin instructions. Daily as directed 05/23/16   [provider]  vitamin C (ASCORBIC ACID) 500 MG tablet Take 500 mg by mouth daily.    [provider]  vitamin E 400 UNIT capsule Take 400 Units by mouth daily.    [provider]    Family History Family History  Problem Relation Age of Onset  . Migraines Mother        Intractable Migraines  . Hypothyroidism Mother   . Stroke Maternal Grandfather        Died at 74  .  Alcohol abuse Neg Hx     Social History Social History   Tobacco Use  . Smoking status: Never Smoker  . Smokeless tobacco: Never Used  Substance Use Topics  . Alcohol use: No  . Drug use: No     Allergies   Dairy aid [lactase]   Review of Systems Review of Systems  All other systems reviewed and are negative.    Physical Exam Updated Vital Signs BP 137/89   Pulse 84   Temp 97.7 F (36.5 C) (Axillary)   Resp 18   Ht 5\' 9"  (1.753 m)   Wt 50.3 kg   SpO2 99%   BMI 16.39 kg/m   Physical Exam  Constitutional: He is oriented to person, place, and time. He appears well-developed and well-nourished. No distress.  HENT:  Head:  Normocephalic and atraumatic.  Mouth/Throat: Oropharynx is clear and moist.  Neck: Normal range of motion. Neck supple.  Cardiovascular: Normal rate and regular rhythm. Exam reveals no friction rub.  No murmur heard. Pulmonary/Chest: Effort normal and breath sounds normal. No respiratory distress. He has no wheezes. He has no rales.  Abdominal: Soft. Bowel sounds are normal. He exhibits no distension. There is no tenderness. There is no rigidity, no rebound and no guarding.  Genitourinary: Testes normal and penis normal.  Musculoskeletal: Normal range of motion. He exhibits no edema.  Neurological: He is alert and oriented to person, place, and time. Coordination normal.  Skin: Skin is warm and dry. He is not diaphoretic.  Nursing note and vitals reviewed.    ED Treatments / Results  Labs (all labs ordered are listed, but only abnormal results are displayed) Labs Reviewed - No data to display  EKG None  Radiology No results found.  Procedures Procedures (including critical care time)  Medications Ordered in ED Medications - No data to display   Initial Impression / Assessment and Plan / ED Course  I have reviewed the triage vital signs and the nursing notes.  Pertinent labs & imaging results that were available during my care of the patient were reviewed by me and considered in my medical decision making (see chart for details).  Patient with history of autism presenting with what mom believes to be either abdominal or testicular pain.  The patient has no additional history as he is minimally verbal.  His work-up reveals no elevation of white count and urinalysis which is clear.  An ultrasound was performed of the testicles as the patient's father (who is a physician) was concerned about torsion.  This was negative as well.  A CT scan was obtained and shows a fecal impaction.  He was given a fleets enema with outstanding results.  The patient is now clinically appearing much  better and I believe appropriate for discharge.  To return as needed for any problems.  Final Clinical Impressions(s) / ED Diagnoses   Final diagnoses:  None    ED Discharge Orders    None       Geoffery Lyonselo, Nicky Kras, MD 07/24/18 1858

## 2018-07-24 NOTE — Discharge Instructions (Addendum)
Return to the emergency department for worsening pain, high fever, or other new and concerning symptoms.

## 2018-07-24 NOTE — ED Notes (Addendum)
Unable to obtain b/p. Pt becoming agitated and was autistic

## 2018-07-24 NOTE — ED Triage Notes (Signed)
Mother states ? abd pain x 2 days.

## 2018-07-24 NOTE — ED Notes (Signed)
Pt on bedside commode having bowel movement.

## 2018-10-25 ENCOUNTER — Other Ambulatory Visit: Payer: Self-pay

## 2018-10-25 ENCOUNTER — Encounter (HOSPITAL_BASED_OUTPATIENT_CLINIC_OR_DEPARTMENT_OTHER): Payer: Self-pay | Admitting: Emergency Medicine

## 2018-10-25 ENCOUNTER — Emergency Department (HOSPITAL_BASED_OUTPATIENT_CLINIC_OR_DEPARTMENT_OTHER)
Admission: EM | Admit: 2018-10-25 | Discharge: 2018-10-25 | Disposition: A | Discharge status: Home / Self Care | Payer: BLUE CROSS/BLUE SHIELD | Attending: Emergency Medicine | Admitting: Emergency Medicine

## 2018-10-25 ENCOUNTER — Emergency Department (HOSPITAL_BASED_OUTPATIENT_CLINIC_OR_DEPARTMENT_OTHER): Payer: BLUE CROSS/BLUE SHIELD

## 2018-10-25 DIAGNOSIS — Z79899 Other long term (current) drug therapy: Secondary | ICD-10-CM | POA: Diagnosis not present

## 2018-10-25 DIAGNOSIS — K5900 Constipation, unspecified: Secondary | ICD-10-CM | POA: Diagnosis not present

## 2018-10-25 DIAGNOSIS — G40909 Epilepsy, unspecified, not intractable, without status epilepticus: Secondary | ICD-10-CM | POA: Insufficient documentation

## 2018-10-25 DIAGNOSIS — F840 Autistic disorder: Secondary | ICD-10-CM | POA: Diagnosis not present

## 2018-10-25 DIAGNOSIS — R1084 Generalized abdominal pain: Secondary | ICD-10-CM | POA: Diagnosis present

## 2018-10-25 LAB — CBC WITH DIFFERENTIAL/PLATELET
ABS IMMATURE GRANULOCYTES: 0.01 10*3/uL (ref 0.00–0.07)
Basophils Absolute: 0 10*3/uL (ref 0.0–0.1)
Basophils Relative: 0 %
Eosinophils Absolute: 0 10*3/uL (ref 0.0–0.5)
Eosinophils Relative: 0 %
HCT: 46.2 % (ref 39.0–52.0)
Hemoglobin: 15.2 g/dL (ref 13.0–17.0)
IMMATURE GRANULOCYTES: 0 %
Lymphocytes Relative: 22 %
Lymphs Abs: 1.3 10*3/uL (ref 0.7–4.0)
MCH: 31.5 pg (ref 26.0–34.0)
MCHC: 32.9 g/dL (ref 30.0–36.0)
MCV: 95.9 fL (ref 80.0–100.0)
MONOS PCT: 11 %
Monocytes Absolute: 0.7 10*3/uL (ref 0.1–1.0)
NEUTROS ABS: 4.1 10*3/uL (ref 1.7–7.7)
NEUTROS PCT: 67 %
PLATELETS: 201 10*3/uL (ref 150–400)
RBC: 4.82 MIL/uL (ref 4.22–5.81)
RDW: 11.4 % — ABNORMAL LOW (ref 11.5–15.5)
WBC: 6.1 10*3/uL (ref 4.0–10.5)
nRBC: 0 % (ref 0.0–0.2)

## 2018-10-25 LAB — COMPREHENSIVE METABOLIC PANEL
ALT: 21 U/L (ref 0–44)
AST: 20 U/L (ref 15–41)
Albumin: 4.7 g/dL (ref 3.5–5.0)
Alkaline Phosphatase: 89 U/L (ref 38–126)
Anion gap: 9 (ref 5–15)
BUN: 12 mg/dL (ref 6–20)
CHLORIDE: 103 mmol/L (ref 98–111)
CO2: 26 mmol/L (ref 22–32)
CREATININE: 0.6 mg/dL — AB (ref 0.61–1.24)
Calcium: 9.2 mg/dL (ref 8.9–10.3)
Glucose, Bld: 99 mg/dL (ref 70–99)
Potassium: 3.6 mmol/L (ref 3.5–5.1)
SODIUM: 138 mmol/L (ref 135–145)
TOTAL PROTEIN: 7.7 g/dL (ref 6.5–8.1)
Total Bilirubin: 0.8 mg/dL (ref 0.3–1.2)

## 2018-10-25 LAB — LIPASE, BLOOD: LIPASE: 27 U/L (ref 11–51)

## 2018-10-25 MED ORDER — MORPHINE SULFATE (PF) 4 MG/ML IV SOLN
4.0000 mg | Freq: Once | INTRAVENOUS | Status: AC
  Administered 2018-10-25: 4 mg via INTRAVENOUS
  Filled 2018-10-25: qty 1

## 2018-10-25 MED ORDER — SODIUM CHLORIDE 0.9 % IV BOLUS
1000.0000 mL | Freq: Once | INTRAVENOUS | Status: AC
  Administered 2018-10-25: 1000 mL via INTRAVENOUS

## 2018-10-25 MED ORDER — MAGNESIUM CITRATE PO SOLN
1.0000 | Freq: Once | ORAL | Status: AC
  Administered 2018-10-25: 1 via ORAL
  Filled 2018-10-25: qty 296

## 2018-10-25 MED ORDER — IOPAMIDOL (ISOVUE-300) INJECTION 61%
100.0000 mL | Freq: Once | INTRAVENOUS | Status: AC | PRN
  Administered 2018-10-25: 100 mL via INTRAVENOUS

## 2018-10-25 MED ORDER — ONDANSETRON HCL 4 MG/2ML IJ SOLN
4.0000 mg | Freq: Once | INTRAMUSCULAR | Status: AC
  Administered 2018-10-25: 4 mg via INTRAVENOUS
  Filled 2018-10-25: qty 2

## 2018-10-25 NOTE — ED Notes (Signed)
EDP at bedside discussing results with patient and family. 

## 2018-10-25 NOTE — ED Notes (Signed)
Patient transported to CT 

## 2018-10-25 NOTE — ED Triage Notes (Signed)
Mother reports she believes he is constipated.  Mother believes he has SBO.  Reports N/V since this morning.  States he had enema on 5:15 this morning and held it for 45 minutes and then was unable to produce anything.  Reports last bowel movement was yesterday and this produced large amounts after 9 days without bowel movement.

## 2018-10-25 NOTE — ED Provider Notes (Signed)
[facility] EMERGENCY DEPARTMENT Provider Note   CSN: [id number] Arrival date & time: 10/25/18  8295    History   Chief Complaint Chief Complaint  Patient presents with  . Abdominal Pain    HPI Roy Price is a 23 y.o. male.     23 year old male brought in by mom for abdominal pain.  Patient has a history of seizure disorder, nonverbal autism, constipation.  Mom states patient was walking strange yesterday and holding his stomach, was given an enema which produced a bowel movement.  Patient continued to be restless yesterday with ongoing abdominal discomfort, mom called PCP who recommended MiraLAX and Gatorade, patient drank a small amount of this but did not finish the bottle.  Mom states patient did not sleep all night, remained restless throughout the night and had one episode of dry heaving.  Mom tried an additional enema and several hours later patient passed the enema fluid without additional stool.  No history of prior abdominal surgeries or bowel obstruction, mom is concerned for possible bowel obstruction.  No other complaints or concerns.     Past Medical History:  Diagnosis Date  . Autism   . Intermittent explosive disorder   . Seizures [facility])     Patient Active Problem List   Diagnosis Date Noted  . Closed head injury 06/15/2017  . Facial laceration 04/07/2017  . 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 Localization-related (focal) (partial) epilepsy and epileptic syndromes with complex partial seizures, without mention of intractable epilepsy 01/17/2013  . Intermittent explosive disorder 01/17/2013  . Nocturnal enuresis 01/17/2013  . Autistic disorder, current or active state 01/17/2013  . 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  . Leukocytopenia, unspecified 01/17/2013  . Encounter for long-term (current) use of other medications 01/17/2013  . Autism 06/17/2012  . Seizures (HCC) 06/17/2012    Past  Surgical History:  Procedure Laterality Date  . arm fracture     due to seizures  . brain surgery    . broken leg    . CRANIOTOMY FOR SUBDURAL IMPLANTATION OF ELECTRODE ARRAY    . OTHER SURGICAL HISTORY     Bilateral Frontal Sub- Pial Resections        Home Medications    Prior to Admission medications   Medication Sig Start Date End Date Taking? Authorizing Provider  cholecalciferol (VITAMIN D) 400 UNITS TABS Take 2,000 Units by mouth at bedtime.     [provider]  co-enzyme Q-10 30 MG capsule Take 30 mg by mouth at bedtime.     [provider]  lacosamide (VIMPAT) 50 MG TABS TAKE 2 AND 1/2 TABLETS BY MOUTH TWICE DAILY Patient taking differently: 300 MG IN THE MORNING AND 250 MG IN THE EVENING 02/17/13   Elveria Rising, NP  Omega-3 Fatty Acids (FISH OIL PO) Take 1 capsule by mouth daily. gummies     [provider]  oseltamivir (TAMIFLU) 75 MG capsule Take 75 mg by mouth.    [provider]  PREVIDENT 5000 BOOSTER PLUS 1.1 % PSTE 1 application See admin instructions. Daily as directed 05/23/16   [provider]  vitamin C (ASCORBIC ACID) 500 MG tablet Take 500 mg by mouth daily.    [provider]  vitamin E 400 UNIT capsule Take 400 Units by mouth daily.    [provider]    Family History Family History  Problem Relation Age of Onset  . Migraines Mother  Intractable Migraines  . Hypothyroidism Mother   . Stroke Maternal Grandfather        Died at 2583  . Alcohol abuse Neg Hx     Social History Social History   Tobacco Use  . Smoking status: Never Smoker  . Smokeless tobacco: Never Used  Substance Use Topics  . Alcohol use: No  . Drug use: No     Allergies   Dairy aid [lactase]   Review of Systems Review of Systems  Unable to perform ROS: Patient nonverbal  Constitutional: Negative for chills, diaphoresis and fever.  Gastrointestinal: Positive for abdominal pain and vomiting. Negative  for abdominal distention.  Genitourinary: Negative for decreased urine volume.  Allergic/Immunologic: Negative for immunocompromised state.  Psychiatric/Behavioral: Positive for agitation.     Physical Exam Updated Vital Signs BP 133/78 (BP Location: Right Arm)   Pulse 83   Temp (!) 97.1 F (36.2 C) (Tympanic)   Resp 18   Ht 5\' 9"  (1.753 m)   Wt 50.3 kg   SpO2 98%   BMI 16.39 kg/m   Physical Exam Vitals signs and nursing note reviewed.  Constitutional:      General: He is not in acute distress.    Appearance: He is well-developed. He is not diaphoretic.  HENT:     Head: Normocephalic and atraumatic.  Cardiovascular:     Rate and Rhythm: Normal rate and regular rhythm.  Pulmonary:     Effort: Pulmonary effort is normal.     Breath sounds: Normal breath sounds.  Abdominal:     General: Abdomen is flat. Bowel sounds are decreased.     Palpations: Abdomen is soft.     Tenderness: There is no abdominal tenderness.  Skin:    General: Skin is warm and dry.     Findings: No rash.  Neurological:     General: No focal deficit present.     Mental Status: He is alert.  Psychiatric:        Behavior: Behavior normal.      ED Treatments / Results  Labs (all labs ordered are listed, but only abnormal results are displayed) Labs Reviewed  CBC WITH DIFFERENTIAL/PLATELET - Abnormal; Notable for the following components:      Result Value   RDW 11.4 (*)    All other components within normal limits  COMPREHENSIVE METABOLIC PANEL - Abnormal; Notable for the following components:   Creatinine, Ser 0.60 (*)    All other components within normal limits  LIPASE, BLOOD  URINALYSIS, ROUTINE W REFLEX MICROSCOPIC    EKG None  Radiology Ct Abdomen Pelvis W Contrast  Result Date: 10/25/2018 CLINICAL DATA:  Acute abdominal pain. EXAM: CT ABDOMEN AND PELVIS WITH CONTRAST TECHNIQUE: Multidetector CT imaging of the abdomen and pelvis was performed using the standard protocol  following bolus administration of intravenous contrast. CONTRAST:  100mL ISOVUE-300 IOPAMIDOL (ISOVUE-300) INJECTION 61% COMPARISON:  07/24/2018 FINDINGS: Lower chest: There are a few scattered patchy ground-glass densities identified within the right lower lobe, likely post infectious/inflammatory. Cystic lesion with surrounding scar in the posterior right middle lobe is identified measuring 2.9 by 1.7 cm and may be the sequelae of prior infection or trauma. Hepatobiliary: Within the posterior right lobe of liver there is a geographic area of relative low attenuation which appears new compared with previous exam. This measures 4.9 x 2.7 cm. Gallbladder appears normal. No biliary dilatation. Pancreas: Unremarkable. No pancreatic ductal dilatation or surrounding inflammatory changes. Spleen: Normal in size without focal abnormality. Adrenals/Urinary Tract:  The kidneys are unremarkable. No mass or hydronephrosis. The urinary bladder appears normal for degree of distention. Stomach/Bowel: Stomach appears normal. No abnormal small bowel dilatation, wall thickening or inflammation. A moderate stool burden is identified from the cecum to the level of the a hepatic flexure. Gaseous distension of the remaining colon is identified to the level of the distal rectum. The rectum is distended, and filled with fluid with a diameter of 5.8 cm. There is wall thickening and mucosal enhancement of the rectum which in a patient that has a history of chronic constipation may reflect stercoral colitis. Vascular/Lymphatic: No significant vascular findings are present. No enlarged abdominal or pelvic lymph nodes. Reproductive: Prostate is unremarkable. Other: No free fluid or fluid collections. Musculoskeletal: A compression deformity involving the L2 vertebra with 30% loss of the superior endplate appears unchanged from previous exam. IMPRESSION: 1. Large stool burden identified within the right colon. The remaining colon is diffusely  distended with gas and liquid stool up to the level of the distal rectum. Wall thickening and mucosal enhancement of the rectum noted compatible with stercoral colitis. 2. New area of low attenuation within the right lobe of liver is favored to represent focal fatty deposition. 3. Mild postinflammatory changes noted in the right lower lobe. Electronically Signed   By: Signa Kell M.D.   On: 10/25/2018 12:00    Procedures Procedures (including critical care time)  Medications Ordered in ED Medications  sodium chloride 0.9 % bolus 1,000 mL (0 mLs Intravenous Stopped 10/25/18 1157)  ondansetron (ZOFRAN) injection 4 mg (4 mg Intravenous Given 10/25/18 1036)  morphine 4 MG/ML injection 4 mg (4 mg Intravenous Given 10/25/18 1038)  iopamidol (ISOVUE-300) 61 % injection 100 mL (100 mLs Intravenous Contrast Given 10/25/18 1124)  magnesium citrate solution 1 Bottle (1 Bottle Oral Given 10/25/18 1343)     Initial Impression / Assessment and Plan / ED Course  I have reviewed the triage vital signs and the nursing notes.  Pertinent labs & imaging results that were available during my care of the patient were reviewed by me and considered in my medical decision making (see chart for details).  Clinical Course as of Oct 26 1523  Fri Oct 25, 2018  2041 23 year old male with history as listed above brought in by mom for abdominal pain and constipation.  Exam no obvious discomfort however seems to have right side abdominal fullness and diminished bowel sounds.  CT shows large amount of liquid stool, lab work unremarkable.  Did manual exam to stimulate bowel movement without success, patient was given magnesium citrate and was able to pass liquid stool and flatulence.  Recommend home to continue with bowel movements and return to ER for fevers or worsening abdominal pain.   [LM]    Clinical Course User Index [LM] Jeannie Fend, PA-C   Final Clinical Impressions(s) / ED Diagnoses   Final diagnoses:    Generalized abdominal pain  Constipation, unspecified constipation type    ED Discharge Orders    None       Jeannie Fend, PA-C 10/25/18 1526    Long, Arlyss Repress, MD 10/26/18 1251

## 2018-10-25 NOTE — ED Notes (Signed)
PA at bedside to disimpact patient.

## 2018-10-25 NOTE — ED Notes (Signed)
Pt remains on bedside commode at this time. Pt has passed liquid stool and is currently passing gas.

## 2018-10-25 NOTE — ED Notes (Signed)
Mom assisting patient in drinking mag citrate at this time. BSC at bedside for patient use. Mother advised to let staff know if assistance is needed.

## 2018-10-25 NOTE — Discharge Instructions (Addendum)
Home to continue bowel movements. Return to ER for fevers, worsening pain or other concerns.  Follow up with your doctor.

## 2019-08-12 DIAGNOSIS — R3981 Functional urinary incontinence: Secondary | ICD-10-CM | POA: Diagnosis not present

## 2019-08-12 DIAGNOSIS — F840 Autistic disorder: Secondary | ICD-10-CM | POA: Diagnosis not present

## 2019-10-13 DIAGNOSIS — R3981 Functional urinary incontinence: Secondary | ICD-10-CM | POA: Diagnosis not present

## 2019-10-13 DIAGNOSIS — F840 Autistic disorder: Secondary | ICD-10-CM | POA: Diagnosis not present

## 2019-11-10 DIAGNOSIS — R3981 Functional urinary incontinence: Secondary | ICD-10-CM | POA: Diagnosis not present

## 2019-11-10 DIAGNOSIS — F840 Autistic disorder: Secondary | ICD-10-CM | POA: Diagnosis not present

## 2019-12-10 DIAGNOSIS — F840 Autistic disorder: Secondary | ICD-10-CM | POA: Diagnosis not present

## 2019-12-10 DIAGNOSIS — R3981 Functional urinary incontinence: Secondary | ICD-10-CM | POA: Diagnosis not present

## 2019-12-30 DIAGNOSIS — R05 Cough: Secondary | ICD-10-CM | POA: Diagnosis not present

## 2020-01-08 DIAGNOSIS — F840 Autistic disorder: Secondary | ICD-10-CM | POA: Diagnosis not present

## 2020-01-08 DIAGNOSIS — R3981 Functional urinary incontinence: Secondary | ICD-10-CM | POA: Diagnosis not present

## 2020-02-09 DIAGNOSIS — R3981 Functional urinary incontinence: Secondary | ICD-10-CM | POA: Diagnosis not present

## 2020-02-09 DIAGNOSIS — F840 Autistic disorder: Secondary | ICD-10-CM | POA: Diagnosis not present

## 2020-03-10 DIAGNOSIS — R3981 Functional urinary incontinence: Secondary | ICD-10-CM | POA: Diagnosis not present

## 2020-03-10 DIAGNOSIS — F840 Autistic disorder: Secondary | ICD-10-CM | POA: Diagnosis not present

## 2020-04-08 DIAGNOSIS — F840 Autistic disorder: Secondary | ICD-10-CM | POA: Diagnosis not present

## 2020-04-08 DIAGNOSIS — R3981 Functional urinary incontinence: Secondary | ICD-10-CM | POA: Diagnosis not present

## 2020-05-11 DIAGNOSIS — F840 Autistic disorder: Secondary | ICD-10-CM | POA: Diagnosis not present

## 2020-05-11 DIAGNOSIS — R3981 Functional urinary incontinence: Secondary | ICD-10-CM | POA: Diagnosis not present

## 2020-06-09 IMAGING — CT CT ABD-PELV W/ CM
2 of 4 series · 14 of 46 positions shown, 16 images · IV contrast (APPLIED)
Comparison: 07/24/2018

CLINICAL DATA: Acute abdominal pain.

EXAM:
CT ABDOMEN AND PELVIS WITH CONTRAST
TECHNIQUE: Multidetector CT imaging of the abdomen and pelvis was performed
using the standard protocol following bolus administration of
intravenous contrast.
CONTRAST:  100mL [id number] IOPAMIDOL ([id number]) INJECTION 61%

[Series 2: axial st · axial · 0.66mm/px · z∈[-476,-56]mm · 11 of 102 slices shown, 13 images]
[im 9/102  soft-tissue]
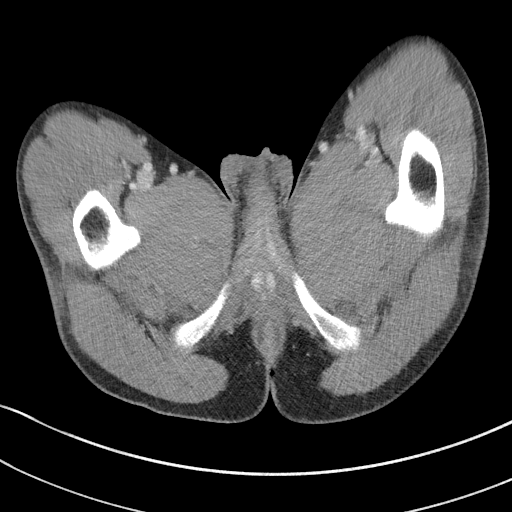
[im 9/102  bone]
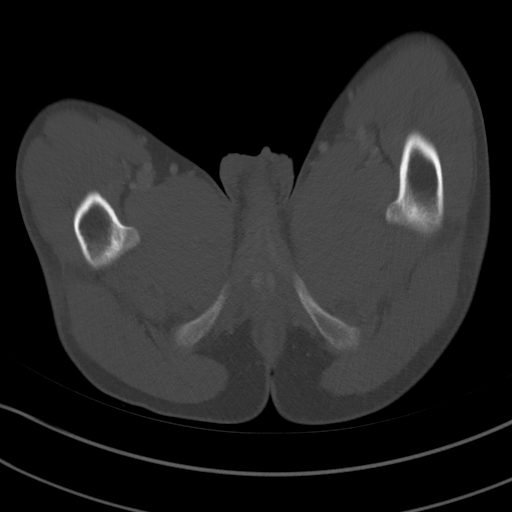
[im 17/102  soft-tissue]
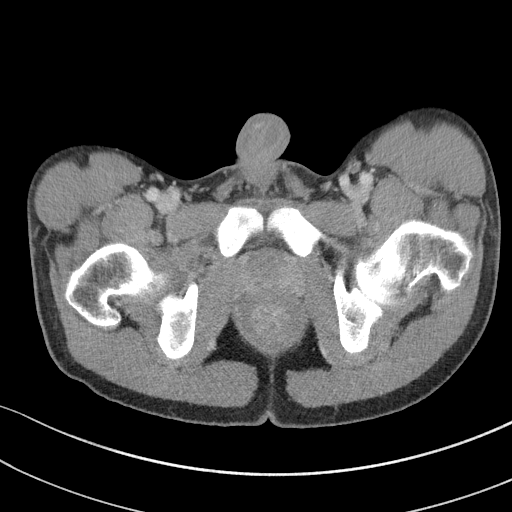
[im 25/102  soft-tissue]
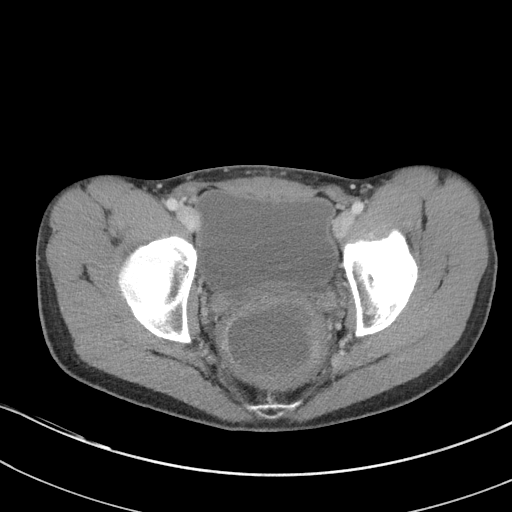
[im 33/102  soft-tissue]
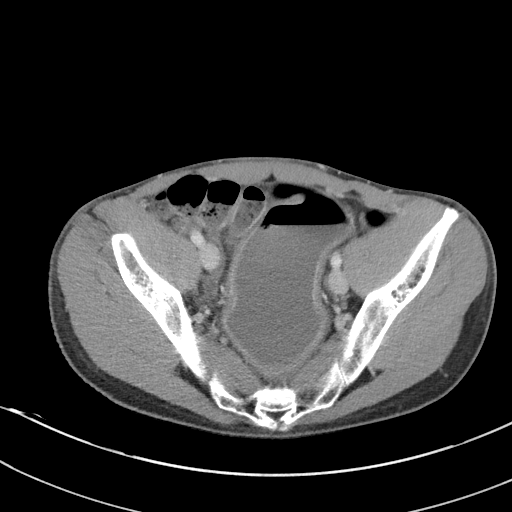
[im 41/102  soft-tissue]
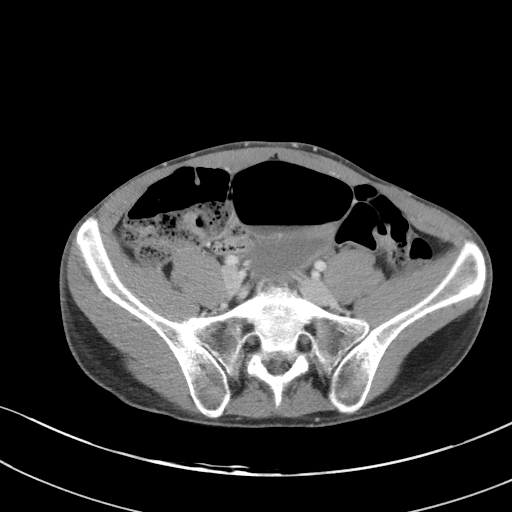
[im 53/102  soft-tissue]
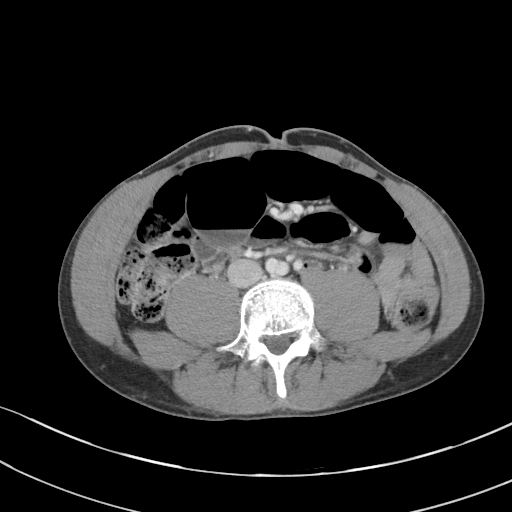
[im 61/102  soft-tissue]
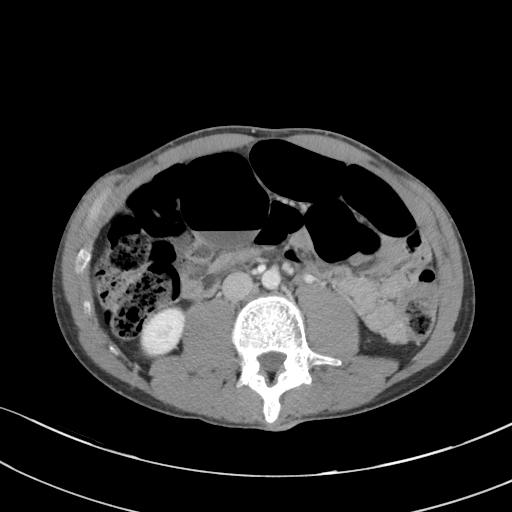
[im 69/102  soft-tissue]
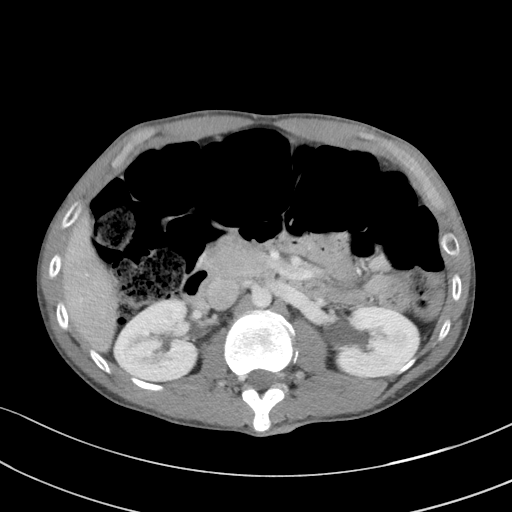
[im 77/102  soft-tissue]
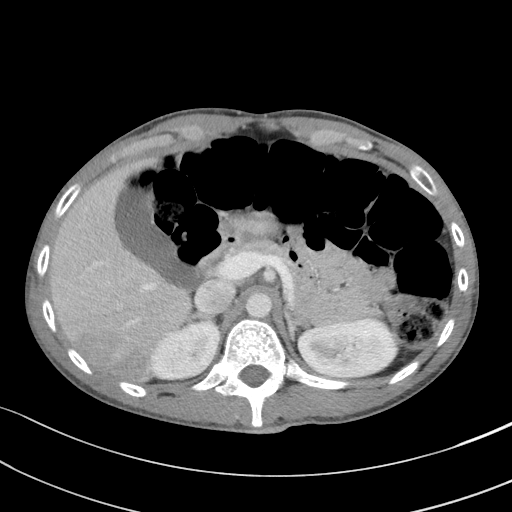
[im 77/102  bone]
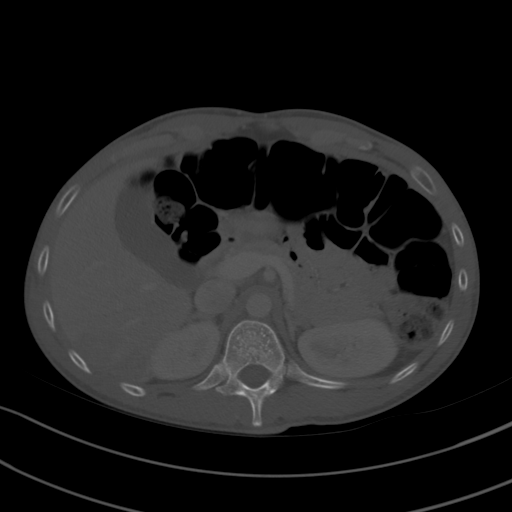
[im 85/102  soft-tissue]
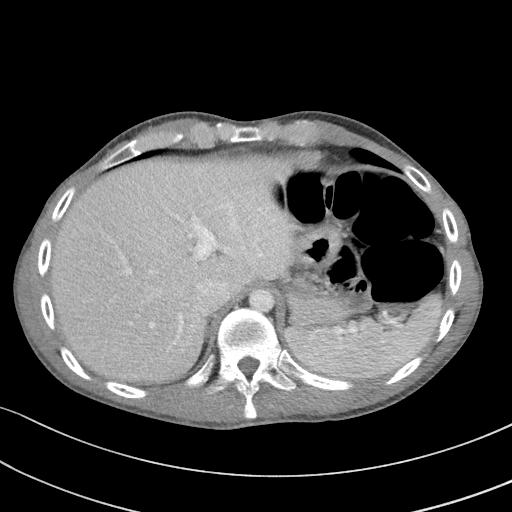
[im 93/102  soft-tissue]
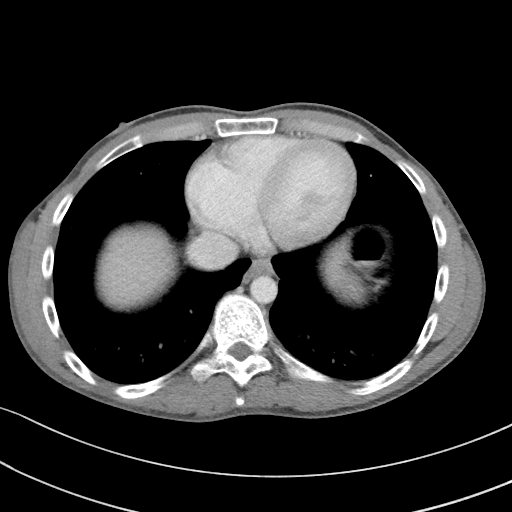

[Series 5: coronal st · coronal · 0.69mm/px · 3 of 78 slices shown]
[im 26/78  soft-tissue]
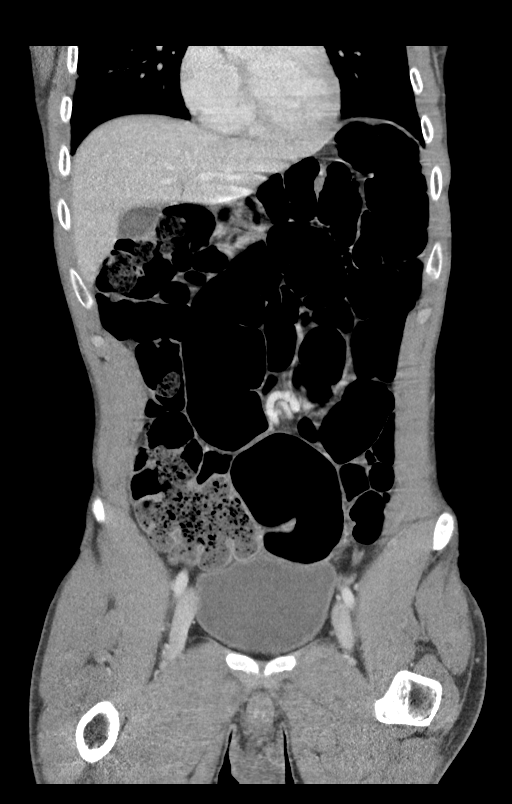
[im 35/78  soft-tissue]
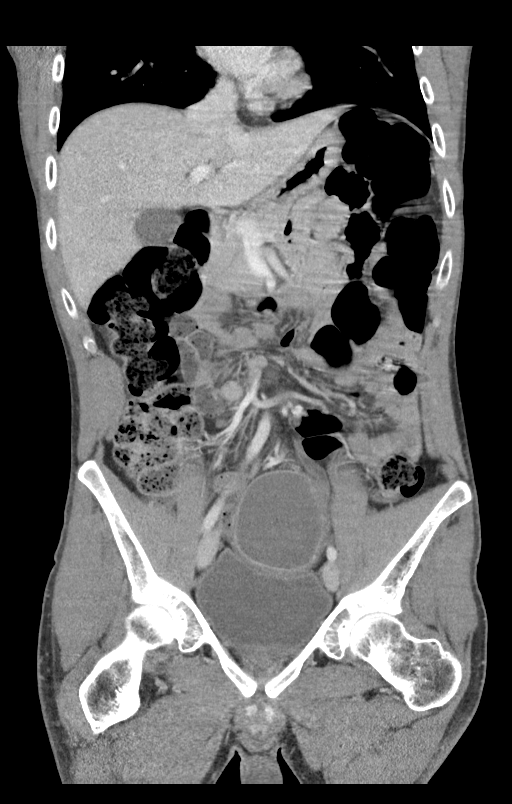
[im 43/78  soft-tissue]
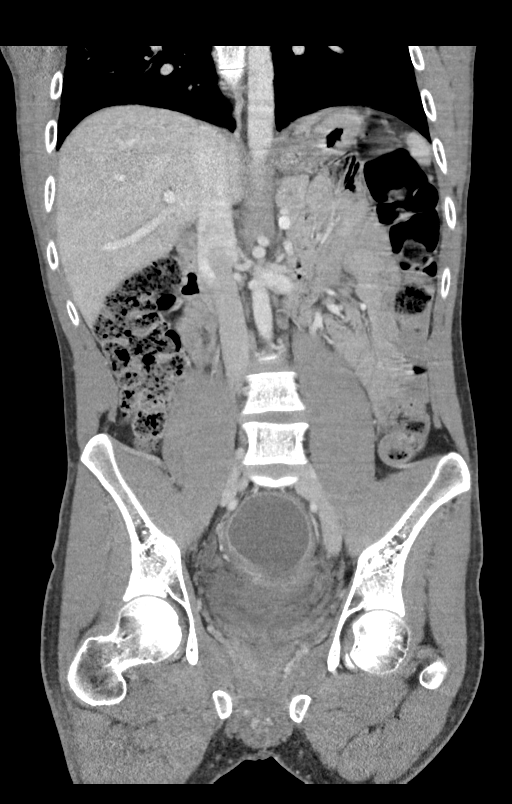

[14 of 46 positions shown; findings below may reference images not displayed]

FINDINGS: Lower chest: There are a few scattered patchy ground-glass densities
identified within the right lower lobe, likely post
infectious/inflammatory. Cystic lesion with surrounding scar in the
posterior right middle lobe is identified measuring 2.9 by 1.7 cm
and may be the sequelae of prior infection or trauma.

Hepatobiliary: Within the posterior right lobe of liver there is a
geographic area of relative low attenuation which appears new
compared with previous exam. This measures 4.9 x 2.7 cm. Gallbladder
appears normal. No biliary dilatation.

Pancreas: Unremarkable. No pancreatic ductal dilatation or
surrounding inflammatory changes.

Spleen: Normal in size without focal abnormality.

Adrenals/Urinary Tract: The kidneys are unremarkable. No mass or
hydronephrosis. The urinary bladder appears normal for degree of
distention.

Stomach/Bowel: Stomach appears normal. No abnormal small bowel
dilatation, wall thickening or inflammation. A moderate stool burden
is identified from the cecum to the level of the a hepatic flexure.
Gaseous distension of the remaining colon is identified to the level
of the distal rectum. The rectum is distended, and filled with fluid
with a diameter of 5.8 cm. There is wall thickening and mucosal
enhancement of the rectum which in a patient that has a history of
chronic constipation may reflect stercoral colitis.

Vascular/Lymphatic: No significant vascular findings are present. No
enlarged abdominal or pelvic lymph nodes.

Reproductive: Prostate is unremarkable.

Other: No free fluid or fluid collections.

Musculoskeletal: A compression deformity involving the L2 vertebra
with 30% loss of the superior endplate appears unchanged from
previous exam.
IMPRESSION: 1. Large stool burden identified within the right colon. The
remaining colon is diffusely distended with gas and liquid stool up
to the level of the distal rectum. Wall thickening and mucosal
enhancement of the rectum noted compatible with stercoral colitis.
2. New area of low attenuation within the right lobe of liver is
favored to represent focal fatty deposition.
3. Mild postinflammatory changes noted in the right lower lobe.

## 2020-07-07 DIAGNOSIS — R3981 Functional urinary incontinence: Secondary | ICD-10-CM | POA: Diagnosis not present

## 2020-07-07 DIAGNOSIS — F840 Autistic disorder: Secondary | ICD-10-CM | POA: Diagnosis not present

## 2020-08-06 DIAGNOSIS — F840 Autistic disorder: Secondary | ICD-10-CM | POA: Diagnosis not present

## 2020-08-06 DIAGNOSIS — R3981 Functional urinary incontinence: Secondary | ICD-10-CM | POA: Diagnosis not present

## 2020-09-07 DIAGNOSIS — R3981 Functional urinary incontinence: Secondary | ICD-10-CM | POA: Diagnosis not present

## 2020-09-07 DIAGNOSIS — F840 Autistic disorder: Secondary | ICD-10-CM | POA: Diagnosis not present

## 2020-10-07 DIAGNOSIS — R3981 Functional urinary incontinence: Secondary | ICD-10-CM | POA: Diagnosis not present

## 2020-10-07 DIAGNOSIS — F840 Autistic disorder: Secondary | ICD-10-CM | POA: Diagnosis not present

## 2020-11-04 DIAGNOSIS — R3981 Functional urinary incontinence: Secondary | ICD-10-CM | POA: Diagnosis not present

## 2020-11-04 DIAGNOSIS — F840 Autistic disorder: Secondary | ICD-10-CM | POA: Diagnosis not present

## 2020-11-29 DIAGNOSIS — R569 Unspecified convulsions: Secondary | ICD-10-CM | POA: Diagnosis not present

## 2021-01-07 DIAGNOSIS — F840 Autistic disorder: Secondary | ICD-10-CM | POA: Diagnosis not present

## 2021-01-07 DIAGNOSIS — R3981 Functional urinary incontinence: Secondary | ICD-10-CM | POA: Diagnosis not present

## 2021-02-09 DIAGNOSIS — F840 Autistic disorder: Secondary | ICD-10-CM | POA: Diagnosis not present

## 2021-02-09 DIAGNOSIS — R3981 Functional urinary incontinence: Secondary | ICD-10-CM | POA: Diagnosis not present

## 2021-04-12 DIAGNOSIS — F840 Autistic disorder: Secondary | ICD-10-CM | POA: Diagnosis not present

## 2021-04-12 DIAGNOSIS — R3981 Functional urinary incontinence: Secondary | ICD-10-CM | POA: Diagnosis not present

## 2021-06-24 DIAGNOSIS — G40909 Epilepsy, unspecified, not intractable, without status epilepticus: Secondary | ICD-10-CM | POA: Diagnosis not present

## 2021-06-24 DIAGNOSIS — F840 Autistic disorder: Secondary | ICD-10-CM | POA: Diagnosis not present

## 2021-07-20 ENCOUNTER — Ambulatory Visit
Admission: RE | Admit: 2021-07-20 | Discharge: 2021-07-20 | Disposition: A | Discharge status: Home / Self Care | Payer: Self-pay | Source: Ambulatory Visit | Attending: Internal Medicine | Admitting: Internal Medicine

## 2021-07-20 ENCOUNTER — Other Ambulatory Visit: Payer: Self-pay | Admitting: Internal Medicine

## 2021-07-20 DIAGNOSIS — J069 Acute upper respiratory infection, unspecified: Secondary | ICD-10-CM | POA: Diagnosis not present

## 2021-07-20 DIAGNOSIS — R051 Acute cough: Secondary | ICD-10-CM | POA: Diagnosis not present

## 2021-07-20 DIAGNOSIS — R059 Cough, unspecified: Secondary | ICD-10-CM | POA: Diagnosis not present

## 2021-08-02 DIAGNOSIS — F840 Autistic disorder: Secondary | ICD-10-CM | POA: Diagnosis not present

## 2021-08-02 DIAGNOSIS — R3981 Functional urinary incontinence: Secondary | ICD-10-CM | POA: Diagnosis not present

## 2021-10-28 DIAGNOSIS — R3981 Functional urinary incontinence: Secondary | ICD-10-CM | POA: Diagnosis not present

## 2021-10-28 DIAGNOSIS — F840 Autistic disorder: Secondary | ICD-10-CM | POA: Diagnosis not present

## 2022-02-13 DIAGNOSIS — F840 Autistic disorder: Secondary | ICD-10-CM | POA: Diagnosis not present

## 2022-02-13 DIAGNOSIS — R3981 Functional urinary incontinence: Secondary | ICD-10-CM | POA: Diagnosis not present

## 2022-03-31 ENCOUNTER — Ambulatory Visit
Admission: RE | Admit: 2022-03-31 | Discharge: 2022-03-31 | Disposition: A | Discharge status: Home / Self Care | Payer: BC Managed Care – PPO | Source: Ambulatory Visit | Attending: Internal Medicine | Admitting: Internal Medicine

## 2022-03-31 ENCOUNTER — Other Ambulatory Visit: Payer: Self-pay | Admitting: Internal Medicine

## 2022-03-31 DIAGNOSIS — R052 Subacute cough: Secondary | ICD-10-CM

## 2022-03-31 DIAGNOSIS — R059 Cough, unspecified: Secondary | ICD-10-CM | POA: Diagnosis not present

## 2022-05-15 DIAGNOSIS — R3981 Functional urinary incontinence: Secondary | ICD-10-CM | POA: Diagnosis not present

## 2022-05-15 DIAGNOSIS — F840 Autistic disorder: Secondary | ICD-10-CM | POA: Diagnosis not present

## 2022-07-13 DIAGNOSIS — F840 Autistic disorder: Secondary | ICD-10-CM | POA: Diagnosis not present

## 2022-07-13 DIAGNOSIS — R3981 Functional urinary incontinence: Secondary | ICD-10-CM | POA: Diagnosis not present

## 2022-07-17 DIAGNOSIS — R569 Unspecified convulsions: Secondary | ICD-10-CM | POA: Diagnosis not present

## 2022-08-08 ENCOUNTER — Ambulatory Visit: Payer: Self-pay | Admitting: Surgery

## 2022-08-08 DIAGNOSIS — R2231 Localized swelling, mass and lump, right upper limb: Secondary | ICD-10-CM | POA: Diagnosis not present

## 2022-08-28 ENCOUNTER — Inpatient Hospital Stay (HOSPITAL_BASED_OUTPATIENT_CLINIC_OR_DEPARTMENT_OTHER)
Admission: EM | Admit: 2022-08-28 | Discharge: 2022-09-02 | DRG: 871 | Disposition: A | Discharge status: Home / Self Care | Payer: BC Managed Care – PPO | Attending: Internal Medicine | Admitting: Internal Medicine

## 2022-08-28 ENCOUNTER — Encounter (HOSPITAL_BASED_OUTPATIENT_CLINIC_OR_DEPARTMENT_OTHER): Payer: Self-pay | Admitting: Emergency Medicine

## 2022-08-28 ENCOUNTER — Encounter (HOSPITAL_COMMUNITY): Payer: Self-pay

## 2022-08-28 ENCOUNTER — Emergency Department (HOSPITAL_BASED_OUTPATIENT_CLINIC_OR_DEPARTMENT_OTHER): Payer: BC Managed Care – PPO

## 2022-08-28 ENCOUNTER — Other Ambulatory Visit: Payer: Self-pay

## 2022-08-28 DIAGNOSIS — F840 Autistic disorder: Secondary | ICD-10-CM | POA: Diagnosis not present

## 2022-08-28 DIAGNOSIS — J9601 Acute respiratory failure with hypoxia: Secondary | ICD-10-CM | POA: Diagnosis not present

## 2022-08-28 DIAGNOSIS — J101 Influenza due to other identified influenza virus with other respiratory manifestations: Secondary | ICD-10-CM | POA: Insufficient documentation

## 2022-08-28 DIAGNOSIS — R059 Cough, unspecified: Secondary | ICD-10-CM | POA: Diagnosis not present

## 2022-08-28 DIAGNOSIS — Z79899 Other long term (current) drug therapy: Secondary | ICD-10-CM

## 2022-08-28 DIAGNOSIS — J690 Pneumonitis due to inhalation of food and vomit: Secondary | ICD-10-CM | POA: Diagnosis not present

## 2022-08-28 DIAGNOSIS — R0603 Acute respiratory distress: Secondary | ICD-10-CM

## 2022-08-28 DIAGNOSIS — Z91011 Allergy to milk products: Secondary | ICD-10-CM | POA: Diagnosis not present

## 2022-08-28 DIAGNOSIS — A419 Sepsis, unspecified organism: Secondary | ICD-10-CM | POA: Insufficient documentation

## 2022-08-28 DIAGNOSIS — Z1152 Encounter for screening for COVID-19: Secondary | ICD-10-CM | POA: Diagnosis not present

## 2022-08-28 DIAGNOSIS — J984 Other disorders of lung: Secondary | ICD-10-CM | POA: Diagnosis not present

## 2022-08-28 DIAGNOSIS — R918 Other nonspecific abnormal finding of lung field: Secondary | ICD-10-CM | POA: Diagnosis not present

## 2022-08-28 DIAGNOSIS — R6520 Severe sepsis without septic shock: Secondary | ICD-10-CM | POA: Diagnosis not present

## 2022-08-28 DIAGNOSIS — A4189 Other specified sepsis: Principal | ICD-10-CM | POA: Diagnosis present

## 2022-08-28 DIAGNOSIS — G40909 Epilepsy, unspecified, not intractable, without status epilepticus: Secondary | ICD-10-CM | POA: Diagnosis not present

## 2022-08-28 DIAGNOSIS — R591 Generalized enlarged lymph nodes: Secondary | ICD-10-CM | POA: Diagnosis not present

## 2022-08-28 DIAGNOSIS — Z823 Family history of stroke: Secondary | ICD-10-CM

## 2022-08-28 DIAGNOSIS — Z86711 Personal history of pulmonary embolism: Secondary | ICD-10-CM | POA: Diagnosis not present

## 2022-08-28 DIAGNOSIS — R000 Tachycardia, unspecified: Secondary | ICD-10-CM | POA: Diagnosis not present

## 2022-08-28 DIAGNOSIS — J111 Influenza due to unidentified influenza virus with other respiratory manifestations: Secondary | ICD-10-CM | POA: Diagnosis not present

## 2022-08-28 DIAGNOSIS — J159 Unspecified bacterial pneumonia: Secondary | ICD-10-CM | POA: Diagnosis not present

## 2022-08-28 DIAGNOSIS — R569 Unspecified convulsions: Secondary | ICD-10-CM

## 2022-08-28 DIAGNOSIS — R079 Chest pain, unspecified: Secondary | ICD-10-CM | POA: Diagnosis not present

## 2022-08-28 DIAGNOSIS — F6381 Intermittent explosive disorder: Secondary | ICD-10-CM | POA: Diagnosis present

## 2022-08-28 DIAGNOSIS — J9690 Respiratory failure, unspecified, unspecified whether with hypoxia or hypercapnia: Secondary | ICD-10-CM | POA: Diagnosis not present

## 2022-08-28 DIAGNOSIS — J189 Pneumonia, unspecified organism: Secondary | ICD-10-CM | POA: Diagnosis not present

## 2022-08-28 LAB — COMPREHENSIVE METABOLIC PANEL
ALT: 20 U/L (ref 0–44)
AST: 34 U/L (ref 15–41)
Albumin: 4.3 g/dL (ref 3.5–5.0)
Alkaline Phosphatase: 64 U/L (ref 38–126)
Anion gap: 14 (ref 5–15)
BUN: 18 mg/dL (ref 6–20)
CO2: 23 mmol/L (ref 22–32)
Calcium: 9 mg/dL (ref 8.9–10.3)
Chloride: 101 mmol/L (ref 98–111)
Creatinine, Ser: 1.11 mg/dL (ref 0.61–1.24)
GFR, Estimated: >60 mL/min (ref >60)
Glucose, Bld: 117 mg/dL — ABNORMAL HIGH (ref 70–99)
Potassium: 3.8 mmol/L (ref 3.5–5.1)
Sodium: 138 mmol/L (ref 135–145)
Total Bilirubin: 0.9 mg/dL (ref 0.3–1.2)
Total Protein: 7.9 g/dL (ref 6.5–8.1)

## 2022-08-28 LAB — CBC WITH DIFFERENTIAL/PLATELET
Abs Immature Granulocytes: 0.04 10*3/uL (ref 0.00–0.07)
Basophils Absolute: 0 10*3/uL (ref 0.0–0.1)
Basophils Relative: 0 %
Eosinophils Absolute: 0 10*3/uL (ref 0.0–0.5)
Eosinophils Relative: 0 %
HCT: 48.6 % (ref 39.0–52.0)
Hemoglobin: 16.9 g/dL (ref 13.0–17.0)
Immature Granulocytes: 0 %
Lymphocytes Relative: 4 %
Lymphs Abs: 0.5 10*3/uL — ABNORMAL LOW (ref 0.7–4.0)
MCH: 32.9 pg (ref 26.0–34.0)
MCHC: 34.8 g/dL (ref 30.0–36.0)
MCV: 94.6 fL (ref 80.0–100.0)
Monocytes Absolute: 1.5 10*3/uL — ABNORMAL HIGH (ref 0.1–1.0)
Monocytes Relative: 14 %
Neutro Abs: 9 10*3/uL — ABNORMAL HIGH (ref 1.7–7.7)
Neutrophils Relative %: 82 %
Platelets: 111 10*3/uL — ABNORMAL LOW (ref 150–400)
RBC: 5.14 MIL/uL (ref 4.22–5.81)
RDW: 11.5 % (ref 11.5–15.5)
WBC: 11 10*3/uL — ABNORMAL HIGH (ref 4.0–10.5)
nRBC: 0 % (ref 0.0–0.2)

## 2022-08-28 LAB — APTT: aPTT: 38 seconds — ABNORMAL HIGH (ref 24–36)

## 2022-08-28 LAB — PROTIME-INR
INR: 1.1 (ref 0.8–1.2)
Prothrombin Time: 14 seconds (ref 11.4–15.2)

## 2022-08-28 LAB — RESP PANEL BY RT-PCR (RSV, FLU A&B, COVID)  RVPGX2
Influenza A by PCR: NEGATIVE
Influenza B by PCR: POSITIVE — AB
Resp Syncytial Virus by PCR: NEGATIVE
SARS Coronavirus 2 by RT PCR: NEGATIVE

## 2022-08-28 LAB — PHOSPHORUS: Phosphorus: 3.6 mg/dL (ref 2.5–4.6)

## 2022-08-28 LAB — MRSA NEXT GEN BY PCR, NASAL: MRSA by PCR Next Gen: NOT DETECTED

## 2022-08-28 LAB — MAGNESIUM: Magnesium: 2 mg/dL (ref 1.7–2.4)

## 2022-08-28 LAB — LACTIC ACID, PLASMA: Lactic Acid, Venous: 2.3 mmol/L (ref 0.5–1.9)

## 2022-08-28 MED ORDER — LACTATED RINGERS IV BOLUS (SEPSIS): 1000.0000 mL | Freq: Once | INTRAVENOUS | Status: DC

## 2022-08-28 MED ORDER — ONDANSETRON HCL 4 MG/2ML IJ SOLN: 4.0000 mg | Freq: Four times a day (QID) | INTRAMUSCULAR | Status: DC | PRN

## 2022-08-28 MED ORDER — ORAL CARE MOUTH RINSE: 15.0000 mL | OROMUCOSAL | Status: DC | PRN

## 2022-08-28 MED ORDER — IOHEXOL 350 MG/ML SOLN
100.0000 mL | Freq: Once | INTRAVENOUS | Status: AC | PRN
  Administered 2022-08-28: 75 mL via INTRAVENOUS

## 2022-08-28 MED ORDER — ACETAMINOPHEN 325 MG PO TABS
650.0000 mg | ORAL_TABLET | Freq: Once | ORAL | Status: AC
  Administered 2022-08-28: 650 mg via ORAL
  Filled 2022-08-28: qty 2

## 2022-08-28 MED ORDER — LACTATED RINGERS IV SOLN: INTRAVENOUS | Status: DC

## 2022-08-28 MED ORDER — LACOSAMIDE 50 MG PO TABS: 150.0000 mg | ORAL_TABLET | Freq: Every day | ORAL | Status: DC

## 2022-08-28 MED ORDER — LACOSAMIDE 50 MG PO TABS
300.0000 mg | ORAL_TABLET | Freq: Every day | ORAL | Status: DC
  Administered 2022-08-29 – 2022-08-30 (×2): 300 mg via ORAL
  Filled 2022-08-28: qty 6

## 2022-08-28 MED ORDER — ACETAMINOPHEN 650 MG RE SUPP: 650.0000 mg | Freq: Four times a day (QID) | RECTAL | Status: DC | PRN

## 2022-08-28 MED ORDER — ACETAMINOPHEN 325 MG PO TABS
650.0000 mg | ORAL_TABLET | Freq: Four times a day (QID) | ORAL | Status: DC | PRN
  Administered 2022-08-29 (×3): 650 mg via ORAL
  Filled 2022-08-28 (×4): qty 2

## 2022-08-28 MED ORDER — CHLORHEXIDINE GLUCONATE CLOTH 2 % EX PADS
6.0000 | MEDICATED_PAD | Freq: Every day | CUTANEOUS | Status: DC
  Administered 2022-08-28 – 2022-09-01 (×4): 6 via TOPICAL

## 2022-08-28 MED ORDER — ENOXAPARIN SODIUM 40 MG/0.4ML IJ SOSY
40.0000 mg | PREFILLED_SYRINGE | INTRAMUSCULAR | Status: DC
  Filled 2022-08-28 (×2): qty 0.4

## 2022-08-28 MED ORDER — SODIUM CHLORIDE 0.9 % IV BOLUS
1000.0000 mL | Freq: Once | INTRAVENOUS | Status: AC
  Administered 2022-08-28: 1000 mL via INTRAVENOUS

## 2022-08-28 MED ORDER — OSELTAMIVIR PHOSPHATE 75 MG PO CAPS
75.0000 mg | ORAL_CAPSULE | Freq: Two times a day (BID) | ORAL | Status: DC
  Administered 2022-08-28: 75 mg via ORAL
  Filled 2022-08-28 (×2): qty 1

## 2022-08-28 MED ORDER — ONDANSETRON HCL 4 MG PO TABS: 4.0000 mg | ORAL_TABLET | Freq: Four times a day (QID) | ORAL | Status: DC | PRN

## 2022-08-28 MED ORDER — LACOSAMIDE 50 MG PO TABS: 300.0000 mg | ORAL_TABLET | Freq: Every day | ORAL | Status: DC

## 2022-08-28 MED ORDER — IBUPROFEN 100 MG/5ML PO SUSP
200.0000 mg | Freq: Once | ORAL | Status: AC
  Administered 2022-08-28: 200 mg via ORAL

## 2022-08-28 MED ORDER — ALBUTEROL SULFATE (2.5 MG/3ML) 0.083% IN NEBU
2.5000 mg | INHALATION_SOLUTION | Freq: Once | RESPIRATORY_TRACT | Status: AC
  Administered 2022-08-28: 2.5 mg via RESPIRATORY_TRACT
  Filled 2022-08-28: qty 3

## 2022-08-28 MED ORDER — SODIUM CHLORIDE 0.9% FLUSH
3.0000 mL | Freq: Two times a day (BID) | INTRAVENOUS | Status: DC
  Administered 2022-08-28 – 2022-09-01 (×7): 3 mL via INTRAVENOUS

## 2022-08-28 MED ORDER — POLYETHYLENE GLYCOL 3350 17 G PO PACK: 17.0000 g | PACK | Freq: Every day | ORAL | Status: DC | PRN

## 2022-08-28 MED ORDER — ACETYLCYSTEINE 20 % IN SOLN
4.0000 mL | Freq: Once | RESPIRATORY_TRACT | Status: AC
  Administered 2022-08-28: 4 mL via RESPIRATORY_TRACT
  Filled 2022-08-28: qty 4

## 2022-08-28 MED ORDER — SODIUM CHLORIDE 0.9 % IV SOLN
3.0000 g | Freq: Four times a day (QID) | INTRAVENOUS | Status: DC
  Administered 2022-08-28 – 2022-08-31 (×11): 3 g via INTRAVENOUS
  Filled 2022-08-28 (×11): qty 8

## 2022-08-28 MED ORDER — SODIUM CHLORIDE 0.9 % IV SOLN
3.0000 g | Freq: Once | INTRAVENOUS | Status: AC
  Administered 2022-08-28: 3 g via INTRAVENOUS

## 2022-08-28 MED ORDER — LACOSAMIDE 100 MG PO TABS: 150.0000 mg | ORAL_TABLET | ORAL | Status: DC

## 2022-08-28 MED ORDER — LACOSAMIDE 50 MG PO TABS
150.0000 mg | ORAL_TABLET | Freq: Every day | ORAL | Status: DC
  Administered 2022-08-29: 150 mg via ORAL

## 2022-08-28 MED ORDER — ACETAMINOPHEN 325 MG PO TABS
ORAL_TABLET | ORAL | Status: AC
  Filled 2022-08-28: qty 1

## 2022-08-28 MED ORDER — OSELTAMIVIR PHOSPHATE 75 MG PO CAPS
75.0000 mg | ORAL_CAPSULE | Freq: Two times a day (BID) | ORAL | Status: AC
  Administered 2022-08-28 – 2022-09-02 (×10): 75 mg via ORAL
  Filled 2022-08-28 (×11): qty 1

## 2022-08-28 NOTE — ED Notes (Signed)
Phone Handoff Report given to rec RN (Timber-RN) at [facility]. Father updated on transfer of patient

## 2022-08-28 NOTE — ED Notes (Signed)
In to round on client, resting more quietly, resp rate varies from 23-42 /min at times. Oxygen saturation is stable at 95-97% with current flow delivery of High Flow 30liters at 1.0FiO2 with NRB also in place. Parents remain at bedside, comfort measures provided. Updated parents on current plan of care

## 2022-08-28 NOTE — ED Provider Notes (Signed)
Signout from Dr. Madilyn Hook.  26 year old male history of autism minimally verbal here with cough shortness of breath.  History of seizures.  Symptoms been going on for 2 days.  Worsening.  Chest x-ray showing peribronchial thickening mildly elevated white count low-grade fevers here.  Requiring oxygen currently on high flow.  Will need admission. Physical Exam  BP 133/81 (BP Location: Left Arm)   Pulse (!) 114   Temp (!) 100.4 F (38 C) (Temporal)   Resp 20   Ht 5\' 10"  (1.778 m)   Wt 55.8 kg   SpO2 94%   BMI 17.65 kg/m   Physical Exam  Procedures  Procedures  ED Course / MDM    Medical Decision Making Amount and/or Complexity of Data Reviewed Labs: ordered. Radiology: ordered. ECG/medicine tests: ordered.  Risk OTC drugs. Prescription drug management. Decision regarding hospitalization.   Patient clinically appears well.  He does have some rhonchi bilaterally and a poor cough.  Requiring oxygen.  Flu positive COVID-negative.  Updated parents.  Discussed with [facility] hospitalist Dr. who will book him a bed for [facility] stepdown unit.

## 2022-08-28 NOTE — Progress Notes (Signed)
Plan of Care Note for accepted transfer   Patient: Roy Price MRN: [id number]   DOA: 08/28/2022  Facility requesting transfer: [facility].  Requesting Provider: Meridee Score, MD Reason for transfer: Acute respiratory failure with hypoxia. Facility course:  Chief Complaint  Patient presents with   Cough    Roy Price is a 26 y.o. male.   The history is provided by a parent.  Cough Roy Price is a 26 y.o. male who presents to the Emergency Department complaining of cough and fever.  He presents to the emergency department accompanied by his parents for evaluation of cough for 2 days with fever that started today.  Family reports that he had low sats at home to 80% on room air.  He has a history of seizure disorder and takes Vimpat as well as autism.  He is nonverbal at baseline.  He typically has about 1 seizure per week and did have a seizure about 1 week ago.  No noted aspiration events.  He has experienced decreased oral intake for the last day.  Family reported very thick secretions.  Lab work:  Lactic acid, plasma [[id number]] (Abnormal)   Collected: 08/28/22 0630   Updated: 08/28/22 0729   Specimen Type: Blood   Specimen Source: Vein    Lactic Acid, Venous 2.3 High Panic  mmol/L  Resp panel by RT-PCR (RSV, Flu A&B, Covid) Anterior Nasal Swab [[id number]] (Abnormal)   Collected: 08/28/22 0633   Updated: 08/28/22 0728   Specimen Source: Anterior Nasal Swab    SARS Coronavirus 2 by RT PCR NEGATIVE   Influenza A by PCR NEGATIVE   Influenza B by PCR POSITIVE Abnormal    Resp Syncytial Virus by PCR NEGATIVE  Comprehensive metabolic panel [[id number]] (Abnormal)   Collected: 08/28/22 0630   Updated: 08/28/22 0708   Specimen Type: Blood   Specimen Source: Vein    Sodium 138 mmol/L   Potassium 3.8 mmol/L   Chloride 101 mmol/L   CO2 23 mmol/L   Glucose, Bld 117 High  mg/dL   BUN 18 mg/dL   Creatinine, Ser 2.44 mg/dL   Calcium 9.0 mg/dL   Total Protein  7.9 g/dL   Albumin 4.3 g/dL   AST 34 U/L   ALT 20 U/L   Alkaline Phosphatase 64 U/L   Total Bilirubin 0.9 mg/dL   GFR, Estimated >01 mL/min   Anion gap 14  Protime-INR [[id number]]   Collected: 08/28/22 0630   Updated: 08/28/22 0701   Specimen Type: Blood   Specimen Source: Vein    Prothrombin Time 14.0 seconds   INR 1.1  APTT [[id number]] (Abnormal)   Collected: 08/28/22 0630   Updated: 08/28/22 0701   Specimen Type: Blood   Specimen Source: Vein    aPTT 38 High  seconds  CBC with Differential [[id number]] (Abnormal)   Collected: 08/28/22 0630   Updated: 08/28/22 0659   Specimen Type: Blood   Specimen Source: Vein    WBC 11.0 High  K/uL   RBC 5.14 MIL/uL   Hemoglobin 16.9 g/dL   HCT 32.9 %   MCV 51.8 fL   MCH 32.9 pg   MCHC 34.8 g/dL   RDW 84.1 %   Platelets 111 Low  K/uL   nRBC 0.0 %   Neutrophils Relative % 82 %   Neutro Abs 9.0 High  K/uL   Lymphocytes Relative 4 %   Lymphs Abs 0.5 Low  K/uL   Monocytes Relative 14 %   Monocytes Absolute  1.5 High  K/uL   Eosinophils Relative 0 %   Eosinophils Absolute 0.0 K/uL   Basophils Relative 0 %   Basophils Absolute 0.0 K/uL   Immature Granulocytes 0 %   Abs Immature Granulocytes 0.04 K/uL   Imaging: CLINICAL DATA:  Questionable sepsis, cough and fever for 2 days EXAM: PORTABLE CHEST 1 VIEW COMPARISON:  03/31/2022   FINDINGS: Clearing of right pneumonia on prior. Airway thickening bilaterally. No edema, effusion, or pneumothorax. Normal heart size.   IMPRESSION: Bronchitic airway thickening.  No focal pneumonia.   Electronically Signed   By: Tiburcio Pea M.D.   On: 08/28/2022 06:43   Plan of care: The patient is accepted for admission to Stepdown unit, at [facility].  He is currently requiring 10 LPM via HFNC.  Possible aspiration pneumonia superimposed on influenza.   Author: Bobette Mo, MD 08/28/2022  Check [email or web address] for on-call coverage.  Nursing staff, Please call [facility]  Admits & Consults System-Wide number on Amion as soon as patient's arrival, so appropriate admitting provider can evaluate the pt.

## 2022-08-28 NOTE — ED Notes (Signed)
Notified PT urine sample is needed, PT is unable to provide one at this time.

## 2022-08-28 NOTE — ED Notes (Signed)
Returns from ct scan, pox at 85% on 10lpm humidified oxygen, flow increased to 15lpm, RT to bedside to assess client and make changes. Coarse rales cont to be noted bilaterally with strong very moist cough.

## 2022-08-28 NOTE — ED Triage Notes (Signed)
Pt cough and fever X 2 days. Pulse in 80's at home.

## 2022-08-28 NOTE — ED Notes (Signed)
POX 99% on High Flow Humidified Device, 30liters with FiO2 1.0, post HHN tx with mucomyst / albuterol

## 2022-08-28 NOTE — H&P (Signed)
History and Physical    Roy Price ZWC:[id number] DOB: 21-Mar-1996 DOA: 08/28/2022  PCP: Maryellen Pile, MD (Inactive)  Patient coming from: Home   Chief Complaint: Cough and fever   HPI: Roy Price is a 26 y.o. male with medical history significant of autism and seizure disorder who presents with cough and fever at home.  Patient is nonverbal and father at bedside provides history.  For past couple of days, patient has had fever, productive cough, noted to have low saturation on room air at home.  Has also had low appetite, no overt vomiting.  ED Course: Patient tested positive for influenza B.  Patient underwent chest x-ray as well as CT chest.  He was referred for admission to [facility], started on Tamiflu and Unasyn.  Review of Systems: Unable to review as patient is nonverbal  Past Medical History:  Diagnosis Date   Autism    Intermittent explosive disorder    Seizures (HCC)     Past Surgical History:  Procedure Laterality Date   arm fracture     due to seizures   brain surgery     broken leg     CRANIOTOMY FOR SUBDURAL IMPLANTATION OF ELECTRODE ARRAY     OTHER SURGICAL HISTORY     Bilateral Frontal Sub- Pial Resections     reports that he has never smoked. He has never used smokeless tobacco. He reports that he does not drink alcohol and does not use drugs.  Allergies  Allergen Reactions   Dairy Aid [Tilactase] Rash    Opioid effect     Family History  Problem Relation Age of Onset   Migraines Mother        Intractable Migraines   Hypothyroidism Mother    Stroke Maternal Grandfather        Died at 67   Alcohol abuse Neg Hx     Prior to Admission medications   Medication Sig Start Date End Date Taking? Authorizing Provider  cholecalciferol (VITAMIN D) 400 UNITS TABS Take 2,000 Units by mouth at bedtime.    Yes [provider]  co-enzyme Q-10 30 MG capsule Take 30 mg by mouth at bedtime.    Yes [provider]  Lacosamide  (VIMPAT) 100 MG TABS Take 150-300 mg by mouth See admin instructions. Take 300 mg by mouth in the morning and then take 150 mg in the evening   Yes [provider]  Omega-3 Fatty Acids (FISH OIL PO) Take 1 capsule by mouth daily. gummies    Yes [provider]  vitamin C (ASCORBIC ACID) 500 MG tablet Take 500 mg by mouth daily.   Yes [provider]  vitamin E 400 UNIT capsule Take 400 Units by mouth daily.   Yes [provider]  lacosamide (VIMPAT) 50 MG TABS TAKE 2 AND 1/2 TABLETS BY MOUTH TWICE DAILY Patient not taking: Reported on 08/28/2022 02/17/13   Elveria Rising, NP    Physical Exam: Vitals:   08/28/22 1430 08/28/22 1445 08/28/22 1530 08/28/22 1732  BP: 113/73 113/75 112/76   Pulse: (!) 102 (!) 104 (!) 106   Resp: (!) 34 (!) 34 (!) 30   Temp: 99.9 F (37.7 C)  99.9 F (37.7 C)   TempSrc:      SpO2: 98% 97% 98% 97%  Weight:      Height:         Constitutional: NAD, calm, comfortable Respiratory: Diminished breath sounds bilaterally, some rhonchi heard on the left  lung field, no wheezing, no respiratory distress, resting comfortably on high flow oxygen without accessory muscle use Cardiovascular: Tachycardic rate and regular rhythm, no murmurs.  Abdomen: Soft, nondistended Musculoskeletal: No joint deformity upper and lower extremities. Neurologic: Alert  Psychiatric: Stable  Labs on Admission: I have personally reviewed following labs and imaging studies  CBC: Recent Labs  Lab 08/28/22 0630  WBC 11.0*  NEUTROABS 9.0*  HGB 16.9  HCT 48.6  MCV 94.6  PLT 111*   Basic Metabolic Panel: Recent Labs  Lab 08/28/22 0630  NA 138  K 3.8  CL 101  CO2 23  GLUCOSE 117*  BUN 18  CREATININE 1.11  CALCIUM 9.0  MG 2.0  PHOS 3.6   GFR: Estimated Creatinine Clearance: 78.9 mL/min (by C-G formula based on SCr of 1.11 mg/dL). Liver Function Tests: Recent Labs  Lab 08/28/22 0630  AST 34  ALT 20  ALKPHOS 64  BILITOT 0.9  PROT  7.9  ALBUMIN 4.3   No results for input(s): "LIPASE", "AMYLASE" in the last 168 hours. No results for input(s): "AMMONIA" in the last 168 hours. Coagulation Profile: Recent Labs  Lab 08/28/22 0630  INR 1.1   Cardiac Enzymes: No results for input(s): "CKTOTAL", "CKMB", "CKMBINDEX", "TROPONINI" in the last 168 hours. BNP (last 3 results) No results for input(s): "PROBNP" in the last 8760 hours. HbA1C: No results for input(s): "HGBA1C" in the last 72 hours. CBG: No results for input(s): "GLUCAP" in the last 168 hours. Lipid Profile: No results for input(s): "CHOL", "HDL", "LDLCALC", "TRIG", "CHOLHDL", "LDLDIRECT" in the last 72 hours. Thyroid Function Tests: No results for input(s): "TSH", "T4TOTAL", "FREET4", "T3FREE", "THYROIDAB" in the last 72 hours. Anemia Panel: No results for input(s): "VITAMINB12", "FOLATE", "FERRITIN", "TIBC", "IRON", "RETICCTPCT" in the last 72 hours. Urine analysis:    Component Value Date/Time   COLORURINE YELLOW 07/24/2018 1657   APPEARANCEUR CLEAR 07/24/2018 1657   LABSPEC 1.015 07/24/2018 1657   PHURINE 6.0 07/24/2018 1657   GLUCOSEU NEGATIVE 07/24/2018 1657   HGBUR NEGATIVE 07/24/2018 1657   BILIRUBINUR NEGATIVE 07/24/2018 1657   KETONESUR NEGATIVE 07/24/2018 1657   PROTEINUR NEGATIVE 07/24/2018 1657   NITRITE NEGATIVE 07/24/2018 1657   LEUKOCYTESUR NEGATIVE 07/24/2018 1657   Sepsis Labs: !!!!!!!!!!!!!!!!!!!!!!!!!!!!!!!!!!!!!!!!!!!! @LABRCNTIP (procalcitonin:4,lacticidven:4) ) Recent Results (from the past 240 hour(s))  Resp panel by RT-PCR (RSV, Flu A&B, Covid) Anterior Nasal Swab     Status: Abnormal   Collection Time: 08/28/22  6:33 AM   Specimen: Anterior Nasal Swab  Result Value Ref Range Status   SARS Coronavirus 2 by RT PCR NEGATIVE NEGATIVE Final    Comment: (NOTE) SARS-CoV-2 target nucleic acids are NOT DETECTED.  The SARS-CoV-2 RNA is generally detectable in upper respiratory specimens during the acute phase of infection. The  lowest concentration of SARS-CoV-2 viral copies this assay can detect is 138 copies/mL. A negative result does not preclude SARS-Cov-2 infection and should not be used as the sole basis for treatment or other patient management decisions. A negative result may occur with  improper specimen collection/handling, submission of specimen other than nasopharyngeal swab, presence of viral mutation(s) within the areas targeted by this assay, and inadequate number of viral copies(<138 copies/mL). A negative result must be combined with clinical observations, patient history, and epidemiological information. The expected result is Negative.  Fact Sheet for Patients:  08/30/22  Fact Sheet for Healthcare Providers:  BloggerCourse.com  This test is no t yet approved or cleared by the [employer] and  has been authorized for  detection and/or diagnosis of SARS-CoV-2 by FDA under an Emergency Use Authorization (EUA). This EUA will remain  in effect (meaning this test can be used) for the duration of the COVID-19 declaration under Section 564(b)(1) of the Act, 21 U.S.C.section 360bbb-3(b)(1), unless the authorization is terminated  or revoked sooner.       Influenza A by PCR NEGATIVE NEGATIVE Final   Influenza B by PCR POSITIVE (A) NEGATIVE Final    Comment: (NOTE) The Xpert Xpress SARS-CoV-2/FLU/RSV plus assay is intended as an aid in the diagnosis of influenza from Nasopharyngeal swab specimens and should not be used as a sole basis for treatment. Nasal washings and aspirates are unacceptable for Xpert Xpress SARS-CoV-2/FLU/RSV testing.  Fact Sheet for Patients: BloggerCourse.com  Fact Sheet for Healthcare Providers: [employer]ious[employer]ker[employer]it  This test is not yet approved or cleared by the [place] FDA and has been authorized for detection and/or diagnosis of SARS-CoV-2 by FDA  under an Emergency Use Authorization (EUA). This EUA will remain in effect (meaning this test can be used) for the duration of the COVID-19 declaration under Section 564(b)(1) of the Act, 21 U.S.C. section 360bbb-3(b)(1), unless the authorization is terminated or revoked.     Resp Syncytial Virus by PCR NEGATIVE NEGATIVE Final    Comment: (NOTE) Fact Sheet for Patients: BloggerCourse.com  Fact Sheet for Healthcare Providers: [employer]it  This test is not yet approved or cleared by the [place] FDA and has been authorized for detection and/or diagnosis of SARS-CoV-2 by FDA under an Emergency Use Authorization (EUA). This EUA will remain in effect (meaning this test can be used) for the duration of the COVID-19 declaration under Section 564(b)(1) of the Act, 21 U.S.C. section 360bbb-3(b)(1), unless the authorization is terminated or revoked.  Performed at [facility], [address] [place], [place] [address]      Radiological Exams on Admission: CT Angio Chest Pulmonary Embolism (PE) W or WO Contrast  Result Date: 08/28/2022 CLINICAL DATA:  Intermediate clinical probability for pulmonary embolism. EXAM: CT ANGIOGRAPHY CHEST WITH CONTRAST TECHNIQUE: Multidetector CT imaging of the chest was performed using the standard protocol during bolus administration of intravenous contrast. Multiplanar CT image reconstructions and MIPs were obtained to evaluate the vascular anatomy. RADIATION DOSE REDUCTION: This exam was performed according to the departmental dose-optimization program which includes automated exposure control, adjustment of the mA and/or kV according to patient size and/or use of iterative reconstruction technique. CONTRAST:  18mL OMNIPAQUE IOHEXOL 350 MG/ML SOLN COMPARISON:  None Available. FINDINGS: Cardiovascular: Satisfactory opacification of pulmonary arteries noted, however significant respiratory  motion artifact limits evaluation of the lower lobe pulmonary arteries. No evidence of thoracic aortic dissection or aneurysm. Mediastinum/Nodes: Mild lymphadenopathy is seen in the subcarinal region measuring 1.5 cm. No other pathologically enlarged lymph nodes identified. Lungs/Pleura: Fluid is seen within the right mainstem and proximal right lower lobe bronchus. Bilateral lower lobe airspace disease is seen. Thin walled cystic lesion is seen within the right middle lobe which contains an air-fluid level, and measures 3.5 x 2.5 cm. No evidence of pleural effusion. Upper abdomen: No acute findings. Musculoskeletal: No suspicious bone lesions identified. Review of the MIP images confirms the above findings. IMPRESSION: Respiratory motion artifacts lymph significantly limits sensitivity of this exam, however no definite evidence of pulmonary embolism identified. Bilateral lower lobe airspace disease, with fluid in right mainstem and proximal right lower lobe bronchus. This is highly suspicious for pneumonia, and aspiration cannot be excluded. 3.5 cm thin-walled cystic lesion in  right middle lobe with air-fluid level, likely a fluid-filled bleb or pneumatocele. Mild subcarinal lymphadenopathy, which may be reactive in etiology. Recommend short-term follow-up by CT in several weeks, or as clinically warranted. Electronically Signed   By: Danae OrleansJohn A Stahl M.D.   On: 08/28/2022 09:31   DG Chest Port 1 View  Result Date: 08/28/2022 CLINICAL DATA:  Questionable sepsis, cough and fever for 2 days EXAM: PORTABLE CHEST 1 VIEW COMPARISON:  03/31/2022 FINDINGS: Clearing of right pneumonia on prior. Airway thickening bilaterally. No edema, effusion, or pneumothorax. Normal heart size. IMPRESSION: Bronchitic airway thickening.  No focal pneumonia. Electronically Signed   By: Tiburcio PeaJonathan  Watts M.D.   On: 08/28/2022 06:43    EKG: Independently reviewed. Sinus tachycardia   Assessment/Plan Principal Problem:   Acute  respiratory failure with hypoxia (HCC) Active Problems:   Autism   Seizures (HCC)   Severe sepsis (HCC)   Influenza B   Aspiration pneumonia (HCC)   Acute hypoxemic respiratory failure -Requiring up to 30L HFNC O2 -Wean as able -Patient appears fairly comfortable on examination on high flow oxygen.  Low threshold for consultation to PCCM if patient were to decompensate.  Severe sepsis secondary to Influenza B as well as superimposed pneumonia, possibly aspiration  -Sepsis POA with fever 100.9, tachycardia tachypnea, respiratory failure -Blood cultures pending -Tamiflu  -Unasyn -IVF   Seizure disorder -Followed by Neurology Dr. Inez Catalina[facility]Donovan [facility]  -Vimpat  -Seizure precautions    DVT prophylaxis: Lovenox   Code Status: Full code, confirmed with father at bedside Family Communication: Father, Dr. Dairl PonderSegal, at bedside Disposition Plan: Return back home under care of parents at time of discharge Consults called: None   Status is: Inpatient Remains inpatient appropriate because: respiratory failure    Severity of Illness: The appropriate patient status for this patient is INPATIENT. Inpatient status is judged to be reasonable and necessary in order to provide the required intensity of service to ensure the patient's safety. The patient's presenting symptoms, physical exam findings, and initial radiographic and laboratory data in the context of their chronic comorbidities is felt to place them at high risk for further clinical deterioration. Furthermore, it is not anticipated that the patient will be medically stable for discharge from the hospital within 2 midnights of admission.   * I certify that at the point of admission it is my clinical judgment that the patient will require inpatient hospital care spanning beyond 2 midnights from the point of admission due to high intensity of service, high risk for further deterioration and high frequency of  surveillance required.Noralee Stain*  Loria Lacina, DO [facility] 08/28/2022, 5:44 PM   Available via Epic secure chat 7am-7pm After these hours, please refer to coverage provider listed on [email or web address]

## 2022-08-28 NOTE — ED Notes (Signed)
In to round on client, sitting in High Fowlers position, family at bedside, pt immediately noted to have a strong very congested cough, coarse rales noted bilaterally. POX at 93% on 10 liters /per min, resp noted to be even but at 20/min, HR at 112/min, skin warm and dry, color WNL, is ill appearing.

## 2022-08-28 NOTE — ED Notes (Signed)
Patient transported to CT 

## 2022-08-28 NOTE — Progress Notes (Signed)
Pharmacy Antibiotic Note  Roy Price is a 26 y.o. male admitted on 08/28/2022 with +influenza and pneumonia.  Pharmacy has been consulted for Unasyn dosing.  Plan: Unasyn 3g IV q6h No dose adjustments anticipated.  Pharmacy will sign off and monitor peripherally via electronic surveillance software for any changes in renal function or micro data.   Height: 5\' 10"  (177.8 cm) Weight: 55.8 kg (123 lb) IBW/kg (Calculated) : 73  Temp (24hrs), Avg:100.4 F (38 C), Min:99.9 F (37.7 C), Max:101.2 F (38.4 C)  Recent Labs  Lab 08/28/22 0630  WBC 11.0*  CREATININE 1.11  LATICACIDVEN 2.3*    Estimated Creatinine Clearance: 78.9 mL/min (by C-G formula based on SCr of 1.11 mg/dL).    Allergies  Allergen Reactions   Dairy Aid [Tilactase] Rash    Opioid effect     Thank you for allowing pharmacy to be a part of this patient's care.  08/30/22, PharmD, BCPS Pharmacy: [phone] 08/28/2022 6:08 PM

## 2022-08-28 NOTE — ED Notes (Signed)
POX cont to be at 82% with 15lpm via [place]. RT at bedside, placed on  30lpm High Flow, Humidified at 1.0FiO2 and with NRB applied as well.  EDP aware

## 2022-08-28 NOTE — ED Provider Notes (Signed)
[facility] EMERGENCY DEPARTMENT Provider Note   CSN: [id number] Arrival date & time: 08/28/22  0450     History  Chief Complaint  Patient presents with   Cough    Roy Price is a 26 y.o. male.  The history is provided by a parent.  Cough Roy Price is a 26 y.o. male who presents to the Emergency Department complaining of cough and fever.  He presents to the emergency department accompanied by his parents for evaluation of cough for 2 days with fever that started today.  Family reports that he had low sats at home to 80% on room air.  He has a history of seizure disorder and takes Vimpat as well as autism.  He is nonverbal at baseline.  He typically has about 1 seizure per week and did have a seizure about 1 week ago.  No noted aspiration events.  He has experienced decreased oral intake for the last day.  Family reported very thick secretions.    Home Medications Prior to Admission medications   Medication Sig Start Date End Date Taking? Authorizing Provider  cholecalciferol (VITAMIN D) 400 UNITS TABS Take 2,000 Units by mouth at bedtime.     [provider]  co-enzyme Q-10 30 MG capsule Take 30 mg by mouth at bedtime.     [provider]  lacosamide (VIMPAT) 50 MG TABS TAKE 2 AND 1/2 TABLETS BY MOUTH TWICE DAILY Patient taking differently: 300 MG IN THE MORNING AND 250 MG IN THE EVENING 02/17/13   Elveria Rising, NP  Omega-3 Fatty Acids (FISH OIL PO) Take 1 capsule by mouth daily. gummies     [provider]  oseltamivir (TAMIFLU) 75 MG capsule Take 75 mg by mouth.    [provider]  PREVIDENT 5000 BOOSTER PLUS 1.1 % PSTE 1 application See admin instructions. Daily as directed 05/23/16   [provider]  vitamin C (ASCORBIC ACID) 500 MG tablet Take 500 mg by mouth daily.    [provider]  vitamin E 400 UNIT capsule Take 400 Units by mouth daily.    [provider]      Allergies    Dairy aid  [tilactase]    Review of Systems   Review of Systems  Respiratory:  Positive for cough.   All other systems reviewed and are negative.   Physical Exam Updated Vital Signs BP 133/81 (BP Location: Left Arm)   Pulse (!) 114   Temp (!) 100.4 F (38 C) (Temporal)   Resp 20   Ht 5\' 10"  (1.778 m)   Wt 55.8 kg   SpO2 94%   BMI 17.65 kg/m  Physical Exam Vitals and nursing note reviewed.  Constitutional:      General: He is in acute distress.     Appearance: He is well-developed.  HENT:     Head: Normocephalic and atraumatic.     Mouth/Throat:     Comments: Dry mucous membranes.  No significant erythema in the posterior oropharynx Cardiovascular:     Rate and Rhythm: Regular rhythm. Tachycardia present.     Heart sounds: No murmur heard. Pulmonary:     Effort: Respiratory distress present.     Comments: Tachypnea with decreased air movement bilaterally Abdominal:     Palpations: Abdomen is soft.     Tenderness: There is no abdominal tenderness. There is no guarding or rebound.  Musculoskeletal:        General: No swelling or tenderness.  Skin:  General: Skin is warm and dry.  Neurological:     Mental Status: He is alert.     Comments: Minimally verbal.  Follows commands.     ED Results / Procedures / Treatments   Labs (all labs ordered are listed, but only abnormal results are displayed) Labs Reviewed  COMPREHENSIVE METABOLIC PANEL - Abnormal; Notable for the following components:      Result Value   Glucose, Bld 117 (*)    All other components within normal limits  CBC WITH DIFFERENTIAL/PLATELET - Abnormal; Notable for the following components:   WBC 11.0 (*)    Platelets 111 (*)    Neutro Abs 9.0 (*)    Lymphs Abs 0.5 (*)    Monocytes Absolute 1.5 (*)    All other components within normal limits  APTT - Abnormal; Notable for the following components:   aPTT 38 (*)    All other components within normal limits  CULTURE, BLOOD (ROUTINE X 2)  CULTURE, BLOOD  (ROUTINE X 2)  URINE CULTURE  RESP PANEL BY RT-PCR (RSV, FLU A&B, COVID)  [id number]  PROTIME-INR  LACTIC ACID, PLASMA  LACTIC ACID, PLASMA  URINALYSIS, ROUTINE W REFLEX MICROSCOPIC    EKG None  Radiology DG Chest Port 1 View  Result Date: 08/28/2022 CLINICAL DATA:  Questionable sepsis, cough and fever for 2 days EXAM: PORTABLE CHEST 1 VIEW COMPARISON:  03/31/2022 FINDINGS: Clearing of right pneumonia on prior. Airway thickening bilaterally. No edema, effusion, or pneumothorax. Normal heart size. IMPRESSION: Bronchitic airway thickening.  No focal pneumonia. Electronically Signed   By: Tiburcio Pea M.D.   On: 08/28/2022 06:43    Procedures Procedures    Medications Ordered in ED Medications  Ampicillin-Sulbactam (UNASYN) 3 g in sodium chloride 0.9 % 100 mL IVPB (3 g Intravenous New Bag/Given 08/28/22 0655)  acetaminophen (TYLENOL) tablet 650 mg (has no administration in time range)  sodium chloride 0.9 % bolus 1,000 mL (1,000 mLs Intravenous New Bag/Given 08/28/22 9758)    ED Course/ Medical Decision Making/ A&P                           Medical Decision Making Amount and/or Complexity of Data Reviewed Labs: ordered. Radiology: ordered. ECG/medicine tests: ordered.  Risk OTC drugs.   Patient with history of seizure disorder, autism spectrum disorder here for evaluation of fever, cough and low oxygen sats.  Patient is ill-appearing on evaluation with tachycardia, tachypnea.  Concern for potential aspiration pneumonia and he was started on broad-spectrum antibiotics.  He does require high flow nasal cannula oxygen to maintain his sats in the mid 90s.  Patient care transferred pending labs.  Anticipate admission for ongoing care.        Final Clinical Impression(s) / ED Diagnoses Final diagnoses:  None    Rx / DC Orders ED Discharge Orders     None         Tilden Fossa, MD 08/28/22 [phone]

## 2022-08-28 NOTE — ED Notes (Signed)
[name] - Father - [phone]

## 2022-08-28 NOTE — ED Notes (Signed)
EDP at bedside to speak with pt family re: admission

## 2022-08-29 DIAGNOSIS — J9601 Acute respiratory failure with hypoxia: Secondary | ICD-10-CM | POA: Diagnosis not present

## 2022-08-29 LAB — CBC
HCT: 41.7 % (ref 39.0–52.0)
Hemoglobin: 14.3 g/dL (ref 13.0–17.0)
MCH: 33.2 pg (ref 26.0–34.0)
MCHC: 34.3 g/dL (ref 30.0–36.0)
MCV: 96.8 fL (ref 80.0–100.0)
Platelets: 102 10*3/uL — ABNORMAL LOW (ref 150–400)
RBC: 4.31 MIL/uL (ref 4.22–5.81)
RDW: 11.8 % (ref 11.5–15.5)
WBC: 10.2 10*3/uL (ref 4.0–10.5)
nRBC: 0 % (ref 0.0–0.2)

## 2022-08-29 LAB — BASIC METABOLIC PANEL
Anion gap: 8 (ref 5–15)
BUN: 18 mg/dL (ref 6–20)
CO2: 26 mmol/L (ref 22–32)
Calcium: 8.3 mg/dL — ABNORMAL LOW (ref 8.9–10.3)
Chloride: 106 mmol/L (ref 98–111)
Creatinine, Ser: 0.94 mg/dL (ref 0.61–1.24)
GFR, Estimated: >60 mL/min (ref >60)
Glucose, Bld: 117 mg/dL — ABNORMAL HIGH (ref 70–99)
Potassium: 4.1 mmol/L (ref 3.5–5.1)
Sodium: 140 mmol/L (ref 135–145)

## 2022-08-29 LAB — HIV ANTIBODY (ROUTINE TESTING W REFLEX): HIV Screen 4th Generation wRfx: NONREACTIVE

## 2022-08-29 LAB — LACTIC ACID, PLASMA: Lactic Acid, Venous: 2.2 mmol/L (ref 0.5–1.9)

## 2022-08-29 MED ORDER — IBUPROFEN 100 MG/5ML PO SUSP
600.0000 mg | Freq: Three times a day (TID) | ORAL | Status: DC | PRN
  Administered 2022-08-30: 600 mg via ORAL
  Filled 2022-08-29: qty 30
  Filled 2022-08-29: qty 180

## 2022-08-29 MED ORDER — SODIUM CHLORIDE 0.9 % IV SOLN: INTRAVENOUS | Status: DC | PRN

## 2022-08-29 MED ORDER — IBUPROFEN 100 MG/5ML PO SUSP: 200.0000 mg | Freq: Three times a day (TID) | ORAL | Status: DC | PRN

## 2022-08-29 NOTE — Progress Notes (Signed)
PROGRESS NOTE    Roy Price  PJK:[id number] DOB: 1996-05-09 DOA: 08/28/2022 PCP: Maryellen Pile, MD (Inactive)     Brief Narrative:  Roy Price is a 26 y.o. male with medical history significant of autism and seizure disorder who presents with cough and fever at home.  Patient is nonverbal and father at bedside provides history.  For past couple of days, patient has had fever, productive cough, noted to have low saturation on room air at home.  Has also had low appetite, no overt vomiting. Patient tested positive for influenza B. Patient underwent chest x-ray as well as CT chest. He was referred for admission to [facility], started on Tamiflu and Unasyn.   New events last 24 hours / Subjective: Patient resting comfortably in bed, high flow oxygen weaning down.  Had episode of fever yesterday 102.5.  Father is at bedside.  Assessment & Plan:  Principal Problem:   Acute respiratory failure with hypoxia (HCC) Active Problems:   Autism   Seizures (HCC)   Severe sepsis (HCC)   Influenza B   Aspiration pneumonia (HCC)   Acute hypoxemic respiratory failure -Requiring up to 30L HFNC O2 -Wean as able, weaned down to 25 L this morning   Severe sepsis secondary to Influenza B as well as superimposed pneumonia, possibly aspiration  -Sepsis POA with fever 100.9, tachycardia tachypnea, respiratory failure -Blood cultures pending -Tamiflu  -Unasyn -IVF    Seizure disorder -Followed by Neurology Dr. Inez Catalina at [facility]  -Vimpat  -Seizure precautions    DVT prophylaxis:  enoxaparin (LOVENOX) injection 40 mg Start: 08/28/22 2200  Code Status: Full code Family Communication: Father at bedside Disposition Plan:  Status is: Inpatient Remains inpatient appropriate because: Respiratory failure   Antimicrobials:  Anti-infectives (From admission, onward)    Start     Dose/Rate Route Frequency Ordered Stop   08/28/22 2200  oseltamivir  (TAMIFLU) capsule 75 mg        75 mg Oral 2 times daily 08/28/22 1803 09/02/22 2159   08/28/22 2000  Ampicillin-Sulbactam (UNASYN) 3 g in sodium chloride 0.9 % 100 mL IVPB        3 g 200 mL/hr over 30 Minutes Intravenous Every 6 hours 08/28/22 1807     08/28/22 1000  oseltamivir (TAMIFLU) capsule 75 mg  Status:  Discontinued        75 mg Oral 2 times daily 08/28/22 0731 08/28/22 1803   08/28/22 0630  Ampicillin-Sulbactam (UNASYN) 3 g in sodium chloride 0.9 % 100 mL IVPB        3 g 200 mL/hr over 30 Minutes Intravenous  Once 08/28/22 0625 08/28/22 0742        Objective: Vitals:   08/29/22 0720 08/29/22 0800 08/29/22 0900 08/29/22 0910  BP:  131/69 119/72   Pulse: 100 (!) 101 (!) 102   Resp: (!) 31 19    Temp:  98.3 F (36.8 C)    TempSrc:  Temporal    SpO2: 97% 96% 99% 97%  Weight:      Height:        Intake/Output Summary (Last 24 hours) at 08/29/2022 1031 Last data filed at 08/29/2022 0930 Gross per 24 hour  Intake 2147.85 ml  Output --  Net 2147.85 ml   Filed Weights   08/28/22 0507  Weight: 55.8 kg    Examination:  General exam: Appears calm and comfortable  Respiratory system: Diminished breath sounds, tachypneic with rate 30 but without accessory muscle  use, does not appear to be in any distress Cardiovascular system: S1 & S2 heard, RRR. No murmurs. No pedal edema. Gastrointestinal system: Abdomen is nondistended, soft and nontender. Normal bowel sounds heard. Central nervous system: Alert  Extremities: Symmetric in appearance   Data Reviewed: I have personally reviewed following labs and imaging studies  CBC: Recent Labs  Lab 08/28/22 0630 08/29/22 0317  WBC 11.0* 10.2  NEUTROABS 9.0*  --   HGB 16.9 14.3  HCT 48.6 41.7  MCV 94.6 96.8  PLT 111* 102*   Basic Metabolic Panel: Recent Labs  Lab 08/28/22 0630 08/29/22 0317  NA 138 140  K 3.8 4.1  CL 101 106  CO2 23 26  GLUCOSE 117* 117*  BUN 18 18  CREATININE 1.11 0.94  CALCIUM 9.0 8.3*  MG  2.0  --   PHOS 3.6  --    GFR: Estimated Creatinine Clearance: 93.2 mL/min (by C-G formula based on SCr of 0.94 mg/dL). Liver Function Tests: Recent Labs  Lab 08/28/22 0630  AST 34  ALT 20  ALKPHOS 64  BILITOT 0.9  PROT 7.9  ALBUMIN 4.3   No results for input(s): "LIPASE", "AMYLASE" in the last 168 hours. No results for input(s): "AMMONIA" in the last 168 hours. Coagulation Profile: Recent Labs  Lab 08/28/22 0630  INR 1.1   Cardiac Enzymes: No results for input(s): "CKTOTAL", "CKMB", "CKMBINDEX", "TROPONINI" in the last 168 hours. BNP (last 3 results) No results for input(s): "PROBNP" in the last 8760 hours. HbA1C: No results for input(s): "HGBA1C" in the last 72 hours. CBG: No results for input(s): "GLUCAP" in the last 168 hours. Lipid Profile: No results for input(s): "CHOL", "HDL", "LDLCALC", "TRIG", "CHOLHDL", "LDLDIRECT" in the last 72 hours. Thyroid Function Tests: No results for input(s): "TSH", "T4TOTAL", "FREET4", "T3FREE", "THYROIDAB" in the last 72 hours. Anemia Panel: No results for input(s): "VITAMINB12", "FOLATE", "FERRITIN", "TIBC", "IRON", "RETICCTPCT" in the last 72 hours. Sepsis Labs: Recent Labs  Lab 08/28/22 0630 08/29/22 0736  LATICACIDVEN 2.3* 2.2*    Recent Results (from the past 240 hour(s))  Resp panel by RT-PCR (RSV, Flu A&B, Covid) Anterior Nasal Swab     Status: Abnormal   Collection Time: 08/28/22  6:33 AM   Specimen: Anterior Nasal Swab  Result Value Ref Range Status   SARS Coronavirus 2 by RT PCR NEGATIVE NEGATIVE Final    Comment: (NOTE) SARS-CoV-2 target nucleic acids are NOT DETECTED.  The SARS-CoV-2 RNA is generally detectable in upper respiratory specimens during the acute phase of infection. The lowest concentration of SARS-CoV-2 viral copies this assay can detect is 138 copies/mL. A negative result does not preclude SARS-Cov-2 infection and should not be used as the sole basis for treatment or other patient management  decisions. A negative result may occur with  improper specimen collection/handling, submission of specimen other than nasopharyngeal swab, presence of viral mutation(s) within the areas targeted by this assay, and inadequate number of viral copies(<138 copies/mL). A negative result must be combined with clinical observations, patient history, and epidemiological information. The expected result is Negative.  Fact Sheet for Patients:  BloggerCourse.com  Fact Sheet for Healthcare Providers:  SeriousBroker.it  This test is no t yet approved or cleared by the [place] FDA and  has been authorized for detection and/or diagnosis of SARS-CoV-2 by FDA under an Emergency Use Authorization (EUA). This EUA will remain  in effect (meaning this test can be used) for the duration of the COVID-19 declaration under Section 564(b)(1) of the  Act, 21 U.S.C.section 360bbb-3(b)(1), unless the authorization is terminated  or revoked sooner.       Influenza A by PCR NEGATIVE NEGATIVE Final   Influenza B by PCR POSITIVE (A) NEGATIVE Final    Comment: (NOTE) The Xpert Xpress SARS-CoV-2/FLU/RSV plus assay is intended as an aid in the diagnosis of influenza from Nasopharyngeal swab specimens and should not be used as a sole basis for treatment. Nasal washings and aspirates are unacceptable for Xpert Xpress SARS-CoV-2/FLU/RSV testing.  Fact Sheet for Patients: BloggerCourse.comhttps://www.fda.gov/media/152166/download  Fact Sheet for Healthcare Providers: SeriousBroker.ithttps://www.fda.gov/media/152162/download  This test is not yet approved or cleared by the [place] FDA and has been authorized for detection and/or diagnosis of SARS-CoV-2 by FDA under an Emergency Use Authorization (EUA). This EUA will remain in effect (meaning this test can be used) for the duration of the COVID-19 declaration under Section 564(b)(1) of the Act, 21 U.S.C. section 360bbb-3(b)(1), unless  the authorization is terminated or revoked.     Resp Syncytial Virus by PCR NEGATIVE NEGATIVE Final    Comment: (NOTE) Fact Sheet for Patients: BloggerCourse.comhttps://www.fda.gov/media/152166/download  Fact Sheet for Healthcare Providers: SeriousBroker.ithttps://www.fda.gov/media/152162/download  This test is not yet approved or cleared by the [place] FDA and has been authorized for detection and/or diagnosis of SARS-CoV-2 by FDA under an Emergency Use Authorization (EUA). This EUA will remain in effect (meaning this test can be used) for the duration of the COVID-19 declaration under Section 564(b)(1) of the Act, 21 U.S.C. section 360bbb-3(b)(1), unless the authorization is terminated or revoked.  Performed at [facility] [place], [address] [place], [place] [address]   MRSA Next Gen by PCR, Nasal     Status: None   Collection Time: 08/28/22  5:11 PM   Specimen: Nasal Mucosa; Nasal Swab  Result Value Ref Range Status   MRSA by PCR Next Gen NOT DETECTED NOT DETECTED Final    Comment: (NOTE) The GeneXpert MRSA Assay (FDA approved for NASAL specimens only), is one component of a comprehensive MRSA colonization surveillance program. It is not intended to diagnose MRSA infection nor to guide or monitor treatment for MRSA infections. Test performance is not FDA approved in patients less than 27 years old. Performed at [facility], [address] [place], [place] [address]       Radiology Studies: CT Angio Chest Pulmonary Embolism (PE) W or WO Contrast  Result Date: 08/28/2022 CLINICAL DATA:  Intermediate clinical probability for pulmonary embolism. EXAM: CT ANGIOGRAPHY CHEST WITH CONTRAST TECHNIQUE: Multidetector CT imaging of the chest was performed using the standard protocol during bolus administration of intravenous contrast. Multiplanar CT image reconstructions and MIPs were obtained to evaluate the vascular anatomy. RADIATION DOSE REDUCTION: This exam was performed  according to the departmental dose-optimization program which includes automated exposure control, adjustment of the mA and/or kV according to patient size and/or use of iterative reconstruction technique. CONTRAST:  75mL OMNIPAQUE IOHEXOL 350 MG/ML SOLN COMPARISON:  None Available. FINDINGS: Cardiovascular: Satisfactory opacification of pulmonary arteries noted, however significant respiratory motion artifact limits evaluation of the lower lobe pulmonary arteries. No evidence of thoracic aortic dissection or aneurysm. Mediastinum/Nodes: Mild lymphadenopathy is seen in the subcarinal region measuring 1.5 cm. No other pathologically enlarged lymph nodes identified. Lungs/Pleura: Fluid is seen within the right mainstem and proximal right lower lobe bronchus. Bilateral lower lobe airspace disease is seen. Thin walled cystic lesion is seen within the right middle lobe which contains an air-fluid level, and measures 3.5 x 2.5 cm. No  evidence of pleural effusion. Upper abdomen: No acute findings. Musculoskeletal: No suspicious bone lesions identified. Review of the MIP images confirms the above findings. IMPRESSION: Respiratory motion artifacts lymph significantly limits sensitivity of this exam, however no definite evidence of pulmonary embolism identified. Bilateral lower lobe airspace disease, with fluid in right mainstem and proximal right lower lobe bronchus. This is highly suspicious for pneumonia, and aspiration cannot be excluded. 3.5 cm thin-walled cystic lesion in right middle lobe with air-fluid level, likely a fluid-filled bleb or pneumatocele. Mild subcarinal lymphadenopathy, which may be reactive in etiology. Recommend short-term follow-up by CT in several weeks, or as clinically warranted. Electronically Signed   By: Danae Orleans M.D.   On: 08/28/2022 09:31   DG Chest Port 1 View  Result Date: 08/28/2022 CLINICAL DATA:  Questionable sepsis, cough and fever for 2 days EXAM: PORTABLE CHEST 1 VIEW  COMPARISON:  03/31/2022 FINDINGS: Clearing of right pneumonia on prior. Airway thickening bilaterally. No edema, effusion, or pneumothorax. Normal heart size. IMPRESSION: Bronchitic airway thickening.  No focal pneumonia. Electronically Signed   By: Tiburcio Pea M.D.   On: 08/28/2022 06:43      Scheduled Meds:  Chlorhexidine Gluconate Cloth  6 each Topical Daily   enoxaparin (LOVENOX) injection  40 mg Subcutaneous Q24H   lacosamide  150 mg Oral QHS   And   lacosamide  300 mg Oral Daily   oseltamivir  75 mg Oral BID   sodium chloride flush  3 mL Intravenous Q12H   Continuous Infusions:  sodium chloride Stopped (08/29/22 0913)   ampicillin-sulbactam (UNASYN) IV Stopped (08/29/22 0912)   lactated ringers Stopped (08/29/22 0840)     LOS: 1 day   Time spent: 25 minutes   Noralee Stain, DO [facility] 08/29/2022, 10:31 AM   Available via Epic secure chat 7am-7pm After these hours, please refer to coverage provider listed on [email or web address]

## 2022-08-29 NOTE — Progress Notes (Signed)
Pt transitioned from Heated HFNC to 10L [place] Tolerating well at this time

## 2022-08-30 DIAGNOSIS — J9601 Acute respiratory failure with hypoxia: Secondary | ICD-10-CM | POA: Diagnosis not present

## 2022-08-30 MED ORDER — GUAIFENESIN-DM 100-10 MG/5ML PO SYRP: 5.0000 mL | ORAL_SOLUTION | ORAL | Status: DC | PRN

## 2022-08-30 MED ORDER — LACOSAMIDE 50 MG PO TABS
150.0000 mg | ORAL_TABLET | ORAL | Status: DC
  Administered 2022-08-30 – 2022-09-01 (×3): 150 mg via ORAL

## 2022-08-30 MED ORDER — IBUPROFEN 100 MG/5ML PO SUSP: 600.0000 mg | Freq: Three times a day (TID) | ORAL | Status: DC | PRN

## 2022-08-30 MED ORDER — LACOSAMIDE 50 MG PO TABS
300.0000 mg | ORAL_TABLET | ORAL | Status: DC
  Administered 2022-08-31 – 2022-09-02 (×2): 300 mg via ORAL
  Filled 2022-08-30: qty 6

## 2022-08-30 MED ORDER — GUAIFENESIN ER 600 MG PO TB12
600.0000 mg | ORAL_TABLET | Freq: Two times a day (BID) | ORAL | Status: DC | PRN
  Administered 2022-09-01 – 2022-09-02 (×3): 600 mg via ORAL
  Filled 2022-08-30 (×4): qty 1

## 2022-08-30 NOTE — TOC Initial Note (Signed)
Transition of Care [facility]) - Initial/Assessment Note    Patient Details  Name: Roy Price MRN: [id number] Date of Birth: 04/21/1996  [facility] of [facility]) CM/SW Contact:    Otelia Santee, LCSW Phone Number: 08/30/2022, 12:47 PM  Clinical Narrative:                 Pt currently requiring O2. Plan to wean O2 as able. TOC will continue to follow for discharge needs.  Expected Discharge Plan: Home/Self Care Barriers to Discharge: Continued Medical Work up   Patient Goals and CMS Choice Patient states their goals for this hospitalization and ongoing recovery are:: For pt to return home CMS Medicare.gov Compare Post Acute Care list provided to:: Legal Guardian Choice offered to / list presented to : [facility] POA / Guardian, Parent [place] ownership interest in [facility].provided to::  (NA)    Expected Discharge Plan and Services In-house Referral: NA Discharge Planning Services: NA Post Acute Care Choice: NA Living arrangements for the past 2 months: Single Family Home                                      Prior Living Arrangements/Services Living arrangements for the past 2 months: Single Family Home Lives with:: Parents Patient language and need for interpreter reviewed:: Yes Do you feel safe going back to the place where you live?: Yes      Need for Family Participation in Patient Care: Yes (Comment) Care giver support system in place?: Yes (comment)   Criminal Activity/Legal Involvement Pertinent to Current Situation/Hospitalization: No - Comment as needed  Activities of Daily Living Home Assistive Devices/Equipment: None ADL Screening (condition at time of admission) Patient's cognitive ability adequate to safely complete daily activities?: No Is the patient deaf or have difficulty hearing?: No Does the patient have difficulty seeing, even when wearing glasses/contacts?: No Does the patient have difficulty concentrating, remembering, or  making decisions?: Yes Patient able to express need for assistance with ADLs?: No Does the patient have difficulty dressing or bathing?: Yes Independently performs ADLs?: No Communication: Needs assistance Is this a change from baseline?: Pre-admission baseline Dressing (OT): Needs assistance Does the patient have difficulty walking or climbing stairs?: No Weakness of Legs: None Weakness of Arms/Hands: None  Permission Sought/Granted Permission sought to share information with : Family Supports    Share Information with NAME: Burech Mcfarland     Permission granted to share info w Relationship: Mother  Permission granted to share info w Contact Information: [phone]  Emotional Assessment   Attitude/Demeanor/Rapport: Unable to Assess Affect (typically observed): Unable to Assess Orientation: : Oriented to Self Alcohol / Substance Use: Not Applicable Psych Involvement: No (comment)  Admission diagnosis:  Acute respiratory failure with hypoxia (HCC) [J96.01] Patient Active Problem List   Diagnosis Date Noted   Acute respiratory failure with hypoxia (HCC) 08/28/2022   Severe sepsis (HCC) 08/28/2022   Influenza B 08/28/2022   Aspiration pneumonia (HCC) 08/28/2022   Closed head injury 06/15/2017   Facial laceration 04/07/2017   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   Localization-related (focal) (partial) epilepsy and epileptic syndromes with complex partial seizures, without mention of intractable epilepsy 01/17/2013   Intermittent explosive disorder 01/17/2013   Nocturnal enuresis 01/17/2013   Autistic disorder, current or active state 01/17/2013   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   Leukocytopenia, unspecified 01/17/2013   Encounter for  long-term (current) use of other medications 01/17/2013   Autism 06/17/2012   Seizures (HCC) 06/17/2012   PCP:  Maryellen Pile, MD (Inactive) Pharmacy:   [id number] -  [place], [place] - [address] AT [facility] OF [facility] [address] [place] [address] Phone: [phone] Fax: [phone]     Social Determinants of Health (SDOH) Social History: SDOH Screenings   Food Insecurity: No Food Insecurity (08/29/2022)  Housing: Low Risk  (08/29/2022)  Transportation Needs: No Transportation Needs (08/29/2022)  Utilities: Not At Risk (08/29/2022)  Tobacco Use: Low Risk  (08/28/2022)   SDOH Interventions:     Readmission Risk Interventions    08/30/2022   12:44 PM 08/30/2022   12:43 PM  Readmission Risk Prevention Plan  Post Dischage Appt Complete Complete  Medication Screening Complete Complete  Transportation Screening Complete Complete

## 2022-08-30 NOTE — Progress Notes (Signed)
08/30/2022 Began coughing, congested but non-productive, weak. Coached by father to take deep breaths and blow out, patient unable to comply.  Drop in sats to low to mid 80s. Increased heated HFNC to 35L and 80% FiO2 without improvement, then went to 40L and 90% again no improvement, so increased FiO2 to 100%. O2 saturation gradually reached 88-89%, then improved to low 90s, weak coughing effort continued. Respiratory therapist Tyson Dense, RRT, notified of above interventions. Will continue to monitor. Cindy S. Clelia Croft BSN, RN, CCRP 08/30/2022 3:20 AM

## 2022-08-30 NOTE — Progress Notes (Signed)
PROGRESS NOTE    Roy Price  QHU:[id number] DOB: Aug 30, 1996 DOA: 08/28/2022 PCP: Maryellen Pile, MD (Inactive)     Brief Narrative:  Roy Price is a 26 y.o. male with medical history significant of autism and seizure disorder who presents with cough and fever at home.  Patient is nonverbal and father at bedside provides history.  For past couple of days, patient has had fever, productive cough, noted to have low saturation on room air at home.  Has also had low appetite, no overt vomiting. Patient tested positive for influenza B. Patient underwent chest x-ray as well as CT chest. He was referred for admission to [facility], started on Tamiflu and Unasyn.   New events last 24 hours / Subjective: Had episode of coughing with desaturation overnight.  FiO2 was increased and patient saturation continue to improve.  This morning, patient is resting comfortably.  Father is at bedside.    Assessment & Plan:  Principal Problem:   Acute respiratory failure with hypoxia (HCC) Active Problems:   Autism   Seizures (HCC)   Severe sepsis (HCC)   Influenza B   Aspiration pneumonia (HCC)   Acute hypoxemic respiratory failure -Requiring up to 40L HFNC O2 -Wean as able, weaned down to 25 L this morning -Stop IVF as he is taking in PO without issues. Wean O2 as able to maintain sat > 92%. Mobilize. Will add guaifenesin to loosen phlegm.    Severe sepsis secondary to Influenza B as well as superimposed pneumonia, possibly aspiration  -Sepsis POA with fever 100.9, tachycardia tachypnea, respiratory failure -Blood cultures negative to date -MRSA PCR negative -Tamiflu  -Unasyn   Seizure disorder -Followed by Neurology Dr. Inez Catalina at [facility]  -Vimpat  -Seizure precautions    DVT prophylaxis:  enoxaparin (LOVENOX) injection 40 mg Start: 08/28/22 2200  Code Status: Full code Family Communication: Father at bedside Disposition Plan:  Status is:  Inpatient Remains inpatient appropriate because: Respiratory failure   Antimicrobials:  Anti-infectives (From admission, onward)    Start     Dose/Rate Route Frequency Ordered Stop   08/28/22 2200  oseltamivir (TAMIFLU) capsule 75 mg        75 mg Oral 2 times daily 08/28/22 1803 09/02/22 2159   08/28/22 2000  Ampicillin-Sulbactam (UNASYN) 3 g in sodium chloride 0.9 % 100 mL IVPB        3 g 200 mL/hr over 30 Minutes Intravenous Every 6 hours 08/28/22 1807     08/28/22 1000  oseltamivir (TAMIFLU) capsule 75 mg  Status:  Discontinued        75 mg Oral 2 times daily 08/28/22 0731 08/28/22 1803   08/28/22 0630  Ampicillin-Sulbactam (UNASYN) 3 g in sodium chloride 0.9 % 100 mL IVPB        3 g 200 mL/hr over 30 Minutes Intravenous  Once 08/28/22 0625 08/28/22 0742        Objective: Vitals:   08/30/22 0700 08/30/22 0800 08/30/22 0831 08/30/22 0904  BP:  134/88    Pulse: 78 86    Resp: 17 (!) 21    Temp:  98 F (36.7 C)    TempSrc:  Temporal    SpO2: 99% 99% 97% 95%  Weight:      Height:        Intake/Output Summary (Last 24 hours) at 08/30/2022 1054 Last data filed at 08/30/2022 0800 Gross per 24 hour  Intake 2414.33 ml  Output --  Net 2414.33 ml  Filed Weights   08/28/22 0507  Weight: 55.8 kg    Examination:  General exam: Appears calm and comfortable  Respiratory system: Diminished breath sounds, without distress, resting comfortably  Cardiovascular system: S1 & S2 heard, RRR. No murmurs. No pedal edema. Gastrointestinal system: Abdomen is nondistended, soft and nontender. Normal bowel sounds heard. Central nervous system: Alert  Extremities: Symmetric in appearance   Data Reviewed: I have personally reviewed following labs and imaging studies  CBC: Recent Labs  Lab 08/28/22 0630 08/29/22 0317  WBC 11.0* 10.2  NEUTROABS 9.0*  --   HGB 16.9 14.3  HCT 48.6 41.7  MCV 94.6 96.8  PLT 111* 102*    Basic Metabolic Panel: Recent Labs  Lab 08/28/22 0630  08/29/22 0317  NA 138 140  K 3.8 4.1  CL 101 106  CO2 23 26  GLUCOSE 117* 117*  BUN 18 18  CREATININE 1.11 0.94  CALCIUM 9.0 8.3*  MG 2.0  --   PHOS 3.6  --     GFR: Estimated Creatinine Clearance: 93.2 mL/min (by C-G formula based on SCr of 0.94 mg/dL). Liver Function Tests: Recent Labs  Lab 08/28/22 0630  AST 34  ALT 20  ALKPHOS 64  BILITOT 0.9  PROT 7.9  ALBUMIN 4.3    No results for input(s): "LIPASE", "AMYLASE" in the last 168 hours. No results for input(s): "AMMONIA" in the last 168 hours. Coagulation Profile: Recent Labs  Lab 08/28/22 0630  INR 1.1    Cardiac Enzymes: No results for input(s): "CKTOTAL", "CKMB", "CKMBINDEX", "TROPONINI" in the last 168 hours. BNP (last 3 results) No results for input(s): "PROBNP" in the last 8760 hours. HbA1C: No results for input(s): "HGBA1C" in the last 72 hours. CBG: No results for input(s): "GLUCAP" in the last 168 hours. Lipid Profile: No results for input(s): "CHOL", "HDL", "LDLCALC", "TRIG", "CHOLHDL", "LDLDIRECT" in the last 72 hours. Thyroid Function Tests: No results for input(s): "TSH", "T4TOTAL", "FREET4", "T3FREE", "THYROIDAB" in the last 72 hours. Anemia Panel: No results for input(s): "VITAMINB12", "FOLATE", "FERRITIN", "TIBC", "IRON", "RETICCTPCT" in the last 72 hours. Sepsis Labs: Recent Labs  Lab 08/28/22 0630 08/29/22 0736  LATICACIDVEN 2.3* 2.2*     Recent Results (from the past 240 hour(s))  Blood Culture (routine x 2)     Status: None (Preliminary result)   Collection Time: 08/28/22  6:30 AM   Specimen: BLOOD RIGHT FOREARM  Result Value Ref Range Status   Specimen Description   Final    BLOOD RIGHT FOREARM Performed at [facility], [address] [place], [place] [address]    Special Requests   Final    BOTTLES DRAWN AEROBIC AND ANAEROBIC Blood Culture results may not be optimal due to an excessive volume of blood received in culture bottles Performed at [facility], [address], [place], [place] [address]    Culture   Final    NO GROWTH 1 DAY Performed at [facility], [address] [place], [place] [address]    Report Status PENDING  Incomplete  Resp panel by RT-PCR (RSV, Flu A&B, Covid) Anterior Nasal Swab     Status: Abnormal   Collection Time: 08/28/22  6:33 AM   Specimen: Anterior Nasal Swab  Result Value Ref Range Status   SARS Coronavirus 2 by RT PCR NEGATIVE NEGATIVE Final    Comment: (NOTE) SARS-CoV-2 target nucleic acids are NOT DETECTED.  The SARS-CoV-2 RNA is generally detectable in upper respiratory specimens  during the acute phase of infection. The lowest concentration of SARS-CoV-2 viral copies this assay can detect is 138 copies/mL. A negative result does not preclude SARS-Cov-2 infection and should not be used as the sole basis for treatment or other patient management decisions. A negative result may occur with  improper specimen collection/handling, submission of specimen other than nasopharyngeal swab, presence of viral mutation(s) within the areas targeted by this assay, and inadequate number of viral copies(<138 copies/mL). A negative result must be combined with clinical observations, patient history, and epidemiological information. The expected result is Negative.  Fact Sheet for Patients:  BloggerCourse.comhttps://www.fda.gov/media/152166/download  Fact Sheet for Healthcare Providers:  SeriousBroker.ithttps://www.fda.gov/media/152162/download  This test is no t yet approved or cleared by the [place] FDA and  has been authorized for detection and/or diagnosis of SARS-CoV-2 by FDA under an Emergency Use Authorization (EUA). This EUA will remain  in effect (meaning this test can be used) for the duration of the COVID-19 declaration under Section 564(b)(1) of the Act, 21 U.S.C.section 360bbb-3(b)(1), unless the authorization is terminated  or revoked sooner.       Influenza A by PCR NEGATIVE NEGATIVE Final   Influenza  B by PCR POSITIVE (A) NEGATIVE Final    Comment: (NOTE) The Xpert Xpress SARS-CoV-2/FLU/RSV plus assay is intended as an aid in the diagnosis of influenza from Nasopharyngeal swab specimens and should not be used as a sole basis for treatment. Nasal washings and aspirates are unacceptable for Xpert Xpress SARS-CoV-2/FLU/RSV testing.  Fact Sheet for Patients: BloggerCourse.comhttps://www.fda.gov/media/152166/download  Fact Sheet for Healthcare Providers: SeriousBroker.ithttps://www.fda.gov/media/152162/download  This test is not yet approved or cleared by the [place] FDA and has been authorized for detection and/or diagnosis of SARS-CoV-2 by FDA under an Emergency Use Authorization (EUA). This EUA will remain in effect (meaning this test can be used) for the duration of the COVID-19 declaration under Section 564(b)(1) of the Act, 21 U.S.C. section 360bbb-3(b)(1), unless the authorization is terminated or revoked.     Resp Syncytial Virus by PCR NEGATIVE NEGATIVE Final    Comment: (NOTE) Fact Sheet for Patients: BloggerCourse.comhttps://www.fda.gov/media/152166/download  Fact Sheet for Healthcare Providers: SeriousBroker.ithttps://www.fda.gov/media/152162/download  This test is not yet approved or cleared by the [place] FDA and has been authorized for detection and/or diagnosis of SARS-CoV-2 by FDA under an Emergency Use Authorization (EUA). This EUA will remain in effect (meaning this test can be used) for the duration of the COVID-19 declaration under Section 564(b)(1) of the Act, 21 U.S.C. section 360bbb-3(b)(1), unless the authorization is terminated or revoked.  Performed at [facility], [address] [place], [place] [id number]   MRSA Next Gen by PCR, Nasal     Status: None   Collection Time: 08/28/22  5:11 PM   Specimen: Nasal Mucosa; Nasal Swab  Result Value Ref Range Status   MRSA by PCR Next Gen NOT DETECTED NOT DETECTED Final    Comment: (NOTE) The GeneXpert MRSA Assay (FDA approved for NASAL  specimens only), is one component of a comprehensive MRSA colonization surveillance program. It is not intended to diagnose MRSA infection nor to guide or monitor treatment for MRSA infections. Test performance is not FDA approved in patients less than 27 years old. Performed at [facility], [address] [place], [place] [address]       Radiology Studies: No results found.    Scheduled Meds:  Chlorhexidine Gluconate Cloth  6 each Topical Daily   enoxaparin (LOVENOX) injection  40 mg Subcutaneous Q24H  lacosamide  150 mg Oral QHS   And   lacosamide  300 mg Oral Daily   oseltamivir  75 mg Oral BID   sodium chloride flush  3 mL Intravenous Q12H   Continuous Infusions:  sodium chloride Stopped (08/30/22 0300)   ampicillin-sulbactam (UNASYN) IV Stopped (08/30/22 5830)     LOS: 2 days   Time spent: 25 minutes   Noralee Stain, DO [facility] 08/30/2022, 10:54 AM   Available via Epic secure chat 7am-7pm After these hours, please refer to coverage provider listed on [email or web address]

## 2022-08-30 NOTE — Evaluation (Signed)
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 Chest imaging showed "Bilateral lower lobe airspace disease, with fluid in right mainstem and proximal right lower lobe bronchus. This is highly suspicious for pneumonia, and aspiration cannot be excluded".   Pt found to be Influenza +, with increasing oxygen needs to 100% HHFNC after drop in sats - now on 10 Liters nasal cannula. Per notes, pt is not following directions for deep breath and cough.  Pt feeds himself prior to admission, eats food that family chop up for him and has adequate nutrition/intake per father, [name].  Swallow eval ordered.    Assessment / Plan / Recommendation  Clinical Impression  Patient presents with functional oropharyngeal swallow based on brief clinical swallow evaluation.  Pt's father present and faciliated session due to pt's autism.  Observed pt consuming water via straw x5 ounces and single bolus of pancake being fed by his father.  Swallow clinically judged to be swift without oral retention. Pt's resting RR is at approx 30 per RN - increasing with effort  and potential anxiety contributing.  Pt's father, neurosurgeon and attorney by trade, denies pt having issues swallowing PTA.  Admits pt has aspirated in the past when he underwent seizure while eating in the past.  Cued cough is weak and pt is not mobilizing yet - thus he is at increased risk of pulmonary compromise if he aspirates. Pt receives medications with applesauce PTA ("for years") per his dad- and does not have any indication of GERD.  Despite pt not passing Yale swallow screen, he does not clinically demonstrate indication of aspiration. Will follow up on Friday to assess po tolerance and determine if MBS is indicated. Advised to monitor pt's tolerance of po closely with low threshold for instrumental eval.  Pt's father and RN agreed to plan. SLP Visit Diagnosis: Dysphagia, unspecified (R13.10)    Aspiration Risk  Mild aspiration risk    Diet Recommendation Thin liquid (all pt's family to choose food items)   Liquid Administration via: Straw Medication Administration: Whole meds with puree Supervision: Comment (family to provide supervision) Compensations: Slow rate;Small sips/bites Postural Changes: Seated upright at 90 degrees;Remain upright for at least 30 minutes after po intake    Other  Recommendations Oral Care Recommendations: Oral care BID    Recommendations for follow up therapy are one component of a multi-disciplinary discharge planning process, led by the attending physician.  Recommendations may be updated based on patient status, additional functional criteria and insurance authorization.  Follow  up Recommendations No SLP follow up      Assistance Recommended at Discharge  Full assist  Functional Status Assessment Patient has had a recent decline in their functional status and/or demonstrates limited ability to make significant improvements in function in a reasonable and predictable amount of time  Frequency and Duration min 1 x/week  1 week       Prognosis  Prognosis for Safe Diet Advancement: Good      Swallow Study   General Date of Onset: 08/30/22 HPI: Pt is a 26 yo male with autism and seizure d/o *has [place] Mal seizure once a week per his father* presents with cough and fever, fall, asp pna, influenza A,  Father noted pt to have low saturation on room air at home prompting his admission. Chest imaging showed "Bilateral lower lobe airspace disease, with fluid in right mainstem and proximal right lower lobe bronchus. This is highly suspicious for pneumonia, and aspiration cannot be excluded".   Pt found to be Influenza +, with increasing oxygen needs to 100% HHFNC after drop in sats - now on 10 Liters nasal cannula. Per notes, pt is not following directions for deep breath and cough.  Pt feeds himself prior to admission, eats food that family chop up for him and has adequate nutrition/intake per father, [name].  Swallow eval ordered. Type of Study: Bedside Swallow Evaluation Diet Prior to this Study: Regular;Thin liquids Temperature Spikes Noted: No Respiratory Status: Nasal cannula History of Recent Intubation: No Behavior/Cognition: Alert;Other (Comment) (inconsistently follows directions) Oral Cavity Assessment: Excessive secretions Oral Care Completed by SLP: No Oral Cavity - Dentition: Adequate natural dentition Self-Feeding Abilities: Able to feed self Patient Positioning: Upright in bed Baseline Vocal Quality: Low vocal intensity Volitional Cough: Weak Volitional Swallow: Unable to elicit    Oral/Motor/Sensory Function Overall Oral Motor/Sensory Function: Generalized oral weakness   Ice Chips Ice chips: Not tested   Thin Liquid Other Comments: Pt did not pass 3 ounce Yale swallow screen (x2) with use of straws due to requiring rest break (incr RR) however swallow appeared timely and no indications of aspiration noted following intake.  He did consume approx 1.5 ounces x2 attempts.    Nectar Thick Nectar Thick Liquid: Not tested    Honey Thick Honey Thick Liquid: Not tested   Puree Puree: Not tested   Solid     Solid: Within functional limits Other Comments: Single bolus of pancake given by pt's father - Adequate mastication with good oral clearance.      Chales Abrahams 08/30/2022,2:17 PM   Rolena Infante, MS [facility] SLP Acute Rehab Services Office [phone] Pager [phone]

## 2022-08-31 ENCOUNTER — Inpatient Hospital Stay (HOSPITAL_COMMUNITY): Payer: BC Managed Care – PPO

## 2022-08-31 DIAGNOSIS — J9601 Acute respiratory failure with hypoxia: Secondary | ICD-10-CM | POA: Diagnosis not present

## 2022-08-31 MED ORDER — DOXYCYCLINE HYCLATE 100 MG PO TABS
100.0000 mg | ORAL_TABLET | Freq: Two times a day (BID) | ORAL | Status: DC
  Administered 2022-08-31 – 2022-09-02 (×5): 100 mg via ORAL
  Filled 2022-08-31 (×5): qty 1

## 2022-08-31 MED ORDER — SODIUM CHLORIDE 0.9 % IV SOLN
1.0000 g | INTRAVENOUS | Status: DC
  Administered 2022-08-31 – 2022-09-01 (×2): 1 g via INTRAVENOUS
  Filled 2022-08-31 (×4): qty 10

## 2022-08-31 MED ORDER — FUROSEMIDE 10 MG/ML IJ SOLN
40.0000 mg | Freq: Once | INTRAMUSCULAR | Status: AC
  Administered 2022-08-31: 40 mg via INTRAVENOUS
  Filled 2022-08-31: qty 4

## 2022-08-31 NOTE — Progress Notes (Signed)
PROGRESS NOTE    Roy Price  HUD:[id number] DOB: [birth year] DOA: 08/28/2022 PCP: Maryellen Pile, MD (Inactive)     Brief Narrative:  Roy Price is a 26 y.o. male with medical history significant of autism and seizure disorder who presents with cough and fever at home.  Patient is nonverbal and father at bedside provides history.  For past couple of days, patient has had fever, productive cough, noted to have low saturation on room air at home.  Has also had low appetite, no overt vomiting. Patient tested positive for influenza B. Patient underwent chest x-ray as well as CT chest. He was referred for admission to [facility], started on Tamiflu and Unasyn.  He required up to 40 L high flow oxygen.  New events last 24 hours / Subjective: No acute events overnight.  Resting comfortably in bed, father at bedside   Assessment & Plan:  Principal Problem:   Acute respiratory failure with hypoxia (HCC) Active Problems:   Autism   Seizures (HCC)   Severe sepsis (HCC)   Influenza B   Aspiration pneumonia (HCC)   Acute hypoxemic respiratory failure -Requiring up to 40L HFNC O2 -Wean as able, weaned down to 10 L high flow oxygen   Severe sepsis secondary to Influenza B as well as superimposed bilateral pneumonia -Sepsis POA with fever 100.9, tachycardia tachypnea, respiratory failure -Blood cultures negative to date -MRSA PCR negative -Tamiflu  -Repeat chest x-ray today reviewed independently, showing significant worsening opacification bilateral lower lung fields including moderate bilateral pleural effusion -Unasyn --> Rocephin/Doxy to increase coverage  -One dose IV lasix and see response    Seizure disorder -Followed by Neurology Dr. Inez Catalina at [facility]  -Vimpat  -Seizure precautions    DVT prophylaxis: Increase mobility, declined lovenox/SCDs  Code Status: Full code Family Communication: Father at bedside Disposition Plan:  Status  is: Inpatient Remains inpatient appropriate because: Respiratory failure   Antimicrobials:  Anti-infectives (From admission, onward)    Start     Dose/Rate Route Frequency Ordered Stop   08/28/22 2200  oseltamivir (TAMIFLU) capsule 75 mg        75 mg Oral 2 times daily 08/28/22 1803 09/02/22 1929   08/28/22 2000  Ampicillin-Sulbactam (UNASYN) 3 g in sodium chloride 0.9 % 100 mL IVPB        3 g 200 mL/hr over 30 Minutes Intravenous Every 6 hours 08/28/22 1807     08/28/22 1000  oseltamivir (TAMIFLU) capsule 75 mg  Status:  Discontinued        75 mg Oral 2 times daily 08/28/22 0731 08/28/22 1803   08/28/22 0630  Ampicillin-Sulbactam (UNASYN) 3 g in sodium chloride 0.9 % 100 mL IVPB        3 g 200 mL/hr over 30 Minutes Intravenous  Once 08/28/22 0625 08/28/22 0742        Objective: Vitals:   08/31/22 0500 08/31/22 0600 08/31/22 0851 08/31/22 0900  BP:  128/70    Pulse: 76 68  81  Resp: (!) 24 (!) 25  20  Temp:   99.8 F (37.7 C)   TempSrc:   Temporal   SpO2: 100% 99%  93%  Weight:      Height:        Intake/Output Summary (Last 24 hours) at 08/31/2022 1218 Last data filed at 08/31/2022 1107 Gross per 24 hour  Intake 641.93 ml  Output --  Net 641.93 ml    [employer]   08/28/22  0507  Weight: 55.8 kg    Examination:  General exam: Appears calm and comfortable  Respiratory system: Diminished breath sounds, without distress, resting comfortably  Cardiovascular system: S1 & S2 heard, RRR. No murmurs. No pedal edema. Gastrointestinal system: Abdomen is nondistended, soft and nontender. Normal bowel sounds heard. Central nervous system: Alert  Extremities: Symmetric in appearance   Data Reviewed: I have personally reviewed following labs and imaging studies  CBC: Recent Labs  Lab 08/28/22 0630 08/29/22 0317  WBC 11.0* 10.2  NEUTROABS 9.0*  --   HGB 16.9 14.3  HCT 48.6 41.7  MCV 94.6 96.8  PLT 111* 102*    Basic Metabolic Panel: Recent Labs  Lab  08/28/22 0630 08/29/22 0317  NA 138 140  K 3.8 4.1  CL 101 106  CO2 23 26  GLUCOSE 117* 117*  BUN 18 18  CREATININE 1.11 0.94  CALCIUM 9.0 8.3*  MG 2.0  --   PHOS 3.6  --     GFR: Estimated Creatinine Clearance: 93.2 mL/min (by C-G formula based on SCr of 0.94 mg/dL). Liver Function Tests: Recent Labs  Lab 08/28/22 0630  AST 34  ALT 20  ALKPHOS 64  BILITOT 0.9  PROT 7.9  ALBUMIN 4.3    No results for input(s): "LIPASE", "AMYLASE" in the last 168 hours. No results for input(s): "AMMONIA" in the last 168 hours. Coagulation Profile: Recent Labs  Lab 08/28/22 0630  INR 1.1    Cardiac Enzymes: No results for input(s): "CKTOTAL", "CKMB", "CKMBINDEX", "TROPONINI" in the last 168 hours. BNP (last 3 results) No results for input(s): "PROBNP" in the last 8760 hours. HbA1C: No results for input(s): "HGBA1C" in the last 72 hours. CBG: No results for input(s): "GLUCAP" in the last 168 hours. Lipid Profile: No results for input(s): "CHOL", "HDL", "LDLCALC", "TRIG", "CHOLHDL", "LDLDIRECT" in the last 72 hours. Thyroid Function Tests: No results for input(s): "TSH", "T4TOTAL", "FREET4", "T3FREE", "THYROIDAB" in the last 72 hours. Anemia Panel: No results for input(s): "VITAMINB12", "FOLATE", "FERRITIN", "TIBC", "IRON", "RETICCTPCT" in the last 72 hours. Sepsis Labs: Recent Labs  Lab 08/28/22 0630 08/29/22 0736  LATICACIDVEN 2.3* 2.2*     Recent Results (from the past 240 hour(s))  Blood Culture (routine x 2)     Status: None (Preliminary result)   Collection Time: 08/28/22  6:30 AM   Specimen: BLOOD RIGHT FOREARM  Result Value Ref Range Status   Specimen Description   Final    BLOOD RIGHT FOREARM Performed at [facility], [address] [place], [place] [address]    Special Requests   Final    BOTTLES DRAWN AEROBIC AND ANAEROBIC Blood Culture results may not be optimal due to an excessive volume of blood received in culture bottles Performed at [facility], [address] [place], [place] [address]    Culture   Final    NO GROWTH 3 DAYS Performed at [facility], [address] [place], [place] [address]    Report Status PENDING  Incomplete  Resp panel by RT-PCR (RSV, Flu A&B, Covid) Anterior Nasal Swab     Status: Abnormal   Collection Time: 08/28/22  6:33 AM   Specimen: Anterior Nasal Swab  Result Value Ref Range Status   SARS Coronavirus 2 by RT PCR NEGATIVE NEGATIVE Final    Comment: (NOTE) SARS-CoV-2 target nucleic acids are NOT DETECTED.  The SARS-CoV-2 RNA is generally detectable in upper respiratory specimens during the acute phase of  infection. The lowest concentration of SARS-CoV-2 viral copies this assay can detect is 138 copies/mL. A negative result does not preclude SARS-Cov-2 infection and should not be used as the sole basis for treatment or other patient management decisions. A negative result may occur with  improper specimen collection/handling, submission of specimen other than nasopharyngeal swab, presence of viral mutation(s) within the areas targeted by this assay, and inadequate number of viral copies(<138 copies/mL). A negative result must be combined with clinical observations, patient history, and epidemiological information. The expected result is Negative.  Fact Sheet for Patients:  BloggerCourse.comhttps://www.fda.gov/media/152166/download  Fact Sheet for Healthcare Providers:  SeriousBroker.ithttps://www.fda.gov/media/152162/download  This test is no t yet approved or cleared by the [place] FDA and  has been authorized for detection and/or diagnosis of SARS-CoV-2 by FDA under an Emergency Use Authorization (EUA). This EUA will remain  in effect (meaning this test can be used) for the duration of the COVID-19 declaration under Section 564(b)(1) of the Act, 21 U.S.C.section 360bbb-3(b)(1), unless the authorization is terminated  or revoked sooner.       Influenza A by PCR NEGATIVE NEGATIVE Final    Influenza B by PCR POSITIVE (A) NEGATIVE Final    Comment: (NOTE) The Xpert Xpress SARS-CoV-2/FLU/RSV plus assay is intended as an aid in the diagnosis of influenza from Nasopharyngeal swab specimens and should not be used as a sole basis for treatment. Nasal washings and aspirates are unacceptable for Xpert Xpress SARS-CoV-2/FLU/RSV testing.  Fact Sheet for Patients: BloggerCourse.comhttps://www.fda.gov/media/152166/download  Fact Sheet for Healthcare Providers: SeriousBroker.ithttps://www.fda.gov/media/152162/download  This test is not yet approved or cleared by the [place] FDA and has been authorized for detection and/or diagnosis of SARS-CoV-2 by FDA under an Emergency Use Authorization (EUA). This EUA will remain in effect (meaning this test can be used) for the duration of the COVID-19 declaration under Section 564(b)(1) of the Act, 21 U.S.C. section 360bbb-3(b)(1), unless the authorization is terminated or revoked.     Resp Syncytial Virus by PCR NEGATIVE NEGATIVE Final    Comment: (NOTE) Fact Sheet for Patients: BloggerCourse.comhttps://www.fda.gov/media/152166/download  Fact Sheet for Healthcare Providers: SeriousBroker.ithttps://www.fda.gov/media/152162/download  This test is not yet approved or cleared by the [place] FDA and has been authorized for detection and/or diagnosis of SARS-CoV-2 by FDA under an Emergency Use Authorization (EUA). This EUA will remain in effect (meaning this test can be used) for the duration of the COVID-19 declaration under Section 564(b)(1) of the Act, 21 U.S.C. section 360bbb-3(b)(1), unless the authorization is terminated or revoked.  Performed at [facility], [address] [place], [place] [address]   MRSA Next Gen by PCR, Nasal     Status: None   Collection Time: 08/28/22  5:11 PM   Specimen: Nasal Mucosa; Nasal Swab  Result Value Ref Range Status   MRSA by PCR Next Gen NOT DETECTED NOT DETECTED Final    Comment: (NOTE) The GeneXpert MRSA Assay (FDA approved for  NASAL specimens only), is one component of a comprehensive MRSA colonization surveillance program. It is not intended to diagnose MRSA infection nor to guide or monitor treatment for MRSA infections. Test performance is not FDA approved in patients less than 27 years old. Performed at [facility], [address] [place], [place] [address]       Radiology Studies: DG CHEST PORT 1 VIEW  Result Date: 08/31/2022 CLINICAL DATA:  Respiratory failure EXAM: PORTABLE CHEST 1 VIEW COMPARISON:  Previous studies including the examination done on 08/28/2022 FINDINGS: Transverse diameter  of heart is slightly increased. Central pulmonary vessels are prominent. There is marked interval increase in opacification in both lower lung fields. Upper lung fields are clear. There is blunting of both lateral CP angles. There is no pneumothorax. IMPRESSION: There is significant increase in opacification in both lower lung fields suggesting moderate bilateral pleural effusions and worsening of underlying multifocal pneumonia. Electronically Signed   By: Ernie Avena M.D.   On: 08/31/2022 11:20      Scheduled Meds:  Chlorhexidine Gluconate Cloth  6 each Topical Daily   enoxaparin (LOVENOX) injection  40 mg Subcutaneous Q24H   lacosamide  150 mg Oral Q24H   And   lacosamide  300 mg Oral Q24H   oseltamivir  75 mg Oral BID   sodium chloride flush  3 mL Intravenous Q12H   Continuous Infusions:  sodium chloride 10 mL/hr at 08/31/22 1107   ampicillin-sulbactam (UNASYN) IV Stopped (08/31/22 0920)     LOS: 3 days   Time spent: 25 minutes   Noralee Stain, DO [facility] 08/31/2022, 12:18 PM   Available via Epic secure chat 7am-7pm After these hours, please refer to coverage provider listed on [email or web address]

## 2022-09-01 DIAGNOSIS — J9601 Acute respiratory failure with hypoxia: Secondary | ICD-10-CM | POA: Diagnosis not present

## 2022-09-01 LAB — CBC
HCT: 39 % (ref 39.0–52.0)
Hemoglobin: 13.2 g/dL (ref 13.0–17.0)
MCH: 32.8 pg (ref 26.0–34.0)
MCHC: 33.8 g/dL (ref 30.0–36.0)
MCV: 96.8 fL (ref 80.0–100.0)
Platelets: 179 10*3/uL (ref 150–400)
RBC: 4.03 MIL/uL — ABNORMAL LOW (ref 4.22–5.81)
RDW: 11.7 % (ref 11.5–15.5)
WBC: 4.3 10*3/uL (ref 4.0–10.5)
nRBC: 0 % (ref 0.0–0.2)

## 2022-09-01 LAB — BASIC METABOLIC PANEL
Anion gap: 8 (ref 5–15)
BUN: 13 mg/dL (ref 6–20)
CO2: 25 mmol/L (ref 22–32)
Calcium: 8.4 mg/dL — ABNORMAL LOW (ref 8.9–10.3)
Chloride: 106 mmol/L (ref 98–111)
Creatinine, Ser: 0.66 mg/dL (ref 0.61–1.24)
GFR, Estimated: >60 mL/min (ref >60)
Glucose, Bld: 101 mg/dL — ABNORMAL HIGH (ref 70–99)
Potassium: 3.4 mmol/L — ABNORMAL LOW (ref 3.5–5.1)
Sodium: 139 mmol/L (ref 135–145)

## 2022-09-01 MED ORDER — POTASSIUM CHLORIDE CRYS ER 20 MEQ PO TBCR
20.0000 meq | EXTENDED_RELEASE_TABLET | Freq: Two times a day (BID) | ORAL | Status: DC
  Administered 2022-09-01 – 2022-09-02 (×3): 20 meq via ORAL
  Filled 2022-09-01 (×3): qty 1

## 2022-09-01 NOTE — Progress Notes (Signed)
PROGRESS NOTE    Tessie FassJoshua D Mireles  ZOX:[id number] DOB: 1995-09-08 DOA: 08/28/2022 PCP: Maryellen Pileubin, David, MD (Inactive)    Brief Narrative:  26 year old with history of autism and seizure disorder presented with cough and fever from home.  Patient was tested positive for influenza B.  A chest x-ray as well as CT scan of the chest was consistent with multifocal pneumonia.  Patient initially on 40 L of oxygen and admitted to stepdown unit.   Assessment & Plan:   Acute hypoxemic respiratory failure secondary to influenza B and suspected superimposed bacterial pneumonia: Antibiotics to treat bacterial pneumonia, initially on Unasyn -changed to Rocephin and doxycycline.   Aspiration precautions.  Clinical aspiration ruled out.  Seen by speech therapy. Continue with chest physiotherapy, incentive spirometry, deep breathing exercises, sputum induction, mucolytic's and bronchodilators. Sputum cultures, blood cultures, negative so far. Supplemental oxygen to keep saturations more than 90%.  Mobilize in the hallway. Tamiflu for 5 days.  Severe sepsis present on admission secondary to above.  Improving.  Seizure disorder with underlying autism: Neurology as outpatient.  On Vimpat.  No evidence of new seizure.  Focus on mobility and chest.  Therapy.  If he is able to come off the oxygen, transfer to MedSurg bed.   DVT prophylaxis: SCDs   Code Status: Full code Family Communication: Father at the bedside Disposition Plan: Status is: Inpatient Remains inpatient appropriate because: Supplemental oxygen, IV antibiotics     Consultants:  None  Procedures:  None  Antimicrobials:  Tamiflu 12/25-- Rocephin and doxycycline 12/25---   Subjective: Patient seen and examined.  Patient's father was at the bedside.  Patient is poor historian.  Overall family noticed that he is looking more comfortable and on minimal oxygen.  Afebrile overnight.  He does have bronchial airway sounds but unable to  expectorate cough.  Cannot participate on chest physiotherapy.  Will use RT administered physiotherapy.  Objective: Vitals:   09/01/22 0845 09/01/22 0900 09/01/22 1000 09/01/22 1210  BP:    123/82  Pulse:    74  Resp:   (!) 23   Temp:      TempSrc:      SpO2: 96% 95%  95%  Weight:      Height:        Intake/Output Summary (Last 24 hours) at 09/01/2022 1340 Last data filed at 09/01/2022 1057 Gross per 24 hour  Intake 107.65 ml  Output --  Net 107.65 ml   [employer]   08/28/22 0507  Weight: 55.8 kg    Examination:  General exam: Appears calm and comfortable.  Calm and interactive with simple answers. Respiratory system: Clear to auscultation. Respiratory effort normal.  Mostly conducted upper airway sounds. Cardiovascular system: S1 & S2 heard, RRR. No pedal edema. Gastrointestinal system: Abdomen is nondistended, soft and nontender. No organomegaly or masses felt. Normal bowel sounds heard. Central nervous system: Alert but not oriented. Extremities: Moves all extremities.    Data Reviewed: I have personally reviewed following labs and imaging studies  CBC: Recent Labs  Lab 08/28/22 0630 08/29/22 0317 09/01/22 0330  WBC 11.0* 10.2 4.3  NEUTROABS 9.0*  --   --   HGB 16.9 14.3 13.2  HCT 48.6 41.7 39.0  MCV 94.6 96.8 96.8  PLT 111* 102* 179   Basic Metabolic Panel: Recent Labs  Lab 08/28/22 0630 08/29/22 0317 09/01/22 0330  NA 138 140 139  K 3.8 4.1 3.4*  CL 101 106 106  CO2 23 26 25   GLUCOSE 117* 117*  101*  BUN 18 18 13   CREATININE 1.11 0.94 0.66  CALCIUM 9.0 8.3* 8.4*  MG 2.0  --   --   PHOS 3.6  --   --    GFR: Estimated Creatinine Clearance: 109.5 mL/min (by C-G formula based on SCr of 0.66 mg/dL). Liver Function Tests: Recent Labs  Lab 08/28/22 0630  AST 34  ALT 20  ALKPHOS 64  BILITOT 0.9  PROT 7.9  ALBUMIN 4.3   No results for input(s): "LIPASE", "AMYLASE" in the last 168 hours. No results for input(s): "AMMONIA" in the last  168 hours. Coagulation Profile: Recent Labs  Lab 08/28/22 0630  INR 1.1   Cardiac Enzymes: No results for input(s): "CKTOTAL", "CKMB", "CKMBINDEX", "TROPONINI" in the last 168 hours. BNP (last 3 results) No results for input(s): "PROBNP" in the last 8760 hours. HbA1C: No results for input(s): "HGBA1C" in the last 72 hours. CBG: No results for input(s): "GLUCAP" in the last 168 hours. Lipid Profile: No results for input(s): "CHOL", "HDL", "LDLCALC", "TRIG", "CHOLHDL", "LDLDIRECT" in the last 72 hours. Thyroid Function Tests: No results for input(s): "TSH", "T4TOTAL", "FREET4", "T3FREE", "THYROIDAB" in the last 72 hours. Anemia Panel: No results for input(s): "VITAMINB12", "FOLATE", "FERRITIN", "TIBC", "IRON", "RETICCTPCT" in the last 72 hours. Sepsis Labs: Recent Labs  Lab 08/28/22 0630 08/29/22 0736  LATICACIDVEN 2.3* 2.2*    Recent Results (from the past 240 hour(s))  Blood Culture (routine x 2)     Status: None (Preliminary result)   Collection Time: 08/28/22  6:30 AM   Specimen: BLOOD RIGHT FOREARM  Result Value Ref Range Status   Specimen Description   Final    BLOOD RIGHT FOREARM Performed at [facility], [address] [place], [place]    Special Requests   Final    BOTTLES DRAWN AEROBIC AND ANAEROBIC Blood Culture results may not be optimal due to an excessive volume of blood received in culture bottles Performed at [facility], [address] [place], [place]    Culture   Final    NO GROWTH 4 DAYS Performed at [facility], [address], [place], [place]    Report Status PENDING  Incomplete  Resp panel by RT-PCR (RSV, Flu A&B, Covid) Anterior Nasal Swab     Status: Abnormal   Collection Time: 08/28/22  6:33 AM   Specimen: Anterior Nasal Swab  Result Value Ref Range Status   SARS Coronavirus 2 by RT PCR NEGATIVE NEGATIVE Final    Comment: (NOTE) SARS-CoV-2 target nucleic acids are NOT  DETECTED.  The SARS-CoV-2 RNA is generally detectable in upper respiratory specimens during the acute phase of infection. The lowest concentration of SARS-CoV-2 viral copies this assay can detect is 138 copies/mL. A negative result does not preclude SARS-Cov-2 infection and should not be used as the sole basis for treatment or other patient management decisions. A negative result may occur with  improper specimen collection/handling, submission of specimen other than nasopharyngeal swab, presence of viral mutation(s) within the areas targeted by this assay, and inadequate number of viral copies(<138 copies/mL). A negative result must be combined with clinical observations, patient history, and epidemiological information. The expected result is Negative.  Fact Sheet for Patients:  08/30/22  Fact Sheet for Healthcare Providers:  BloggerCourse.com  This test is no t yet approved or cleared by the [place] FDA and  has been authorized for detection and/or diagnosis of SARS-CoV-2 by FDA under  an Emergency Use Authorization (EUA). This EUA will remain  in effect (meaning this test can be used) for the duration of the COVID-19 declaration under Section 564(b)(1) of the Act, 21 U.S.C.section 360bbb-3(b)(1), unless the authorization is terminated  or revoked sooner.       Influenza A by PCR NEGATIVE NEGATIVE Final   Influenza B by PCR POSITIVE (A) NEGATIVE Final    Comment: (NOTE) The Xpert Xpress SARS-CoV-2/FLU/RSV plus assay is intended as an aid in the diagnosis of influenza from Nasopharyngeal swab specimens and should not be used as a sole basis for treatment. Nasal washings and aspirates are unacceptable for Xpert Xpress SARS-CoV-2/FLU/RSV testing.  Fact Sheet for Patients: BloggerCourse.com  Fact Sheet for Healthcare Providers: SeriousBroker.it  This test is not  yet approved or cleared by the [place] FDA and has been authorized for detection and/or diagnosis of SARS-CoV-2 by FDA under an Emergency Use Authorization (EUA). This EUA will remain in effect (meaning this test can be used) for the duration of the COVID-19 declaration under Section 564(b)(1) of the Act, 21 U.S.C. section 360bbb-3(b)(1), unless the authorization is terminated or revoked.     Resp Syncytial Virus by PCR NEGATIVE NEGATIVE Final    Comment: (NOTE) Fact Sheet for Patients: BloggerCourse.com  Fact Sheet for Healthcare Providers: SeriousBroker.it  This test is not yet approved or cleared by the [place] FDA and has been authorized for detection and/or diagnosis of SARS-CoV-2 by FDA under an Emergency Use Authorization (EUA). This EUA will remain in effect (meaning this test can be used) for the duration of the COVID-19 declaration under Section 564(b)(1) of the Act, 21 U.S.C. section 360bbb-3(b)(1), unless the authorization is terminated or revoked.  Performed at [facility], [address] [place], [place] [address]   MRSA Next Gen by PCR, Nasal     Status: None   Collection Time: 08/28/22  5:11 PM   Specimen: Nasal Mucosa; Nasal Swab  Result Value Ref Range Status   MRSA by PCR Next Gen NOT DETECTED NOT DETECTED Final    Comment: (NOTE) The GeneXpert MRSA Assay (FDA approved for NASAL specimens only), is one component of a comprehensive MRSA colonization surveillance program. It is not intended to diagnose MRSA infection nor to guide or monitor treatment for MRSA infections. Test performance is not FDA approved in patients less than 110 years old. Performed at [facility], [address] [place], [place] [address]          Radiology Studies: DG CHEST PORT 1 VIEW  Result Date: 08/31/2022 CLINICAL DATA:  Respiratory failure EXAM: PORTABLE CHEST 1 VIEW  COMPARISON:  Previous studies including the examination done on 08/28/2022 FINDINGS: Transverse diameter of heart is slightly increased. Central pulmonary vessels are prominent. There is marked interval increase in opacification in both lower lung fields. Upper lung fields are clear. There is blunting of both lateral CP angles. There is no pneumothorax. IMPRESSION: There is significant increase in opacification in both lower lung fields suggesting moderate bilateral pleural effusions and worsening of underlying multifocal pneumonia. Electronically Signed   By: Ernie Avena M.D.   On: 08/31/2022 11:20        Scheduled Meds:  Chlorhexidine Gluconate Cloth  6 each Topical Daily   doxycycline  100 mg Oral Q12H   lacosamide  150 mg Oral Q24H   And   lacosamide  300 mg Oral Q24H   oseltamivir  75 mg Oral BID   potassium chloride  20 mEq Oral BID   sodium chloride flush  3 mL Intravenous Q12H   Continuous Infusions:  sodium chloride Stopped (08/31/22 1134)   cefTRIAXone (ROCEPHIN)  IV Stopped (08/31/22 1458)     LOS: 4 days    Time spent: 35 minutes    Dorcas Carrow, MD [facility] Pager [phone]

## 2022-09-02 DIAGNOSIS — J9601 Acute respiratory failure with hypoxia: Secondary | ICD-10-CM | POA: Diagnosis not present

## 2022-09-02 LAB — CULTURE, BLOOD (ROUTINE X 2): Culture: NO GROWTH

## 2022-09-02 MED ORDER — DOXYCYCLINE HYCLATE 100 MG PO TABS: 100.0000 mg | ORAL_TABLET | Freq: Two times a day (BID) | ORAL | 0 refills | Status: AC

## 2022-09-02 MED ORDER — GUAIFENESIN ER 600 MG PO TB12: 600.0000 mg | ORAL_TABLET | Freq: Two times a day (BID) | ORAL | 0 refills | Status: DC

## 2022-09-02 MED ORDER — HYDROCODONE BIT-HOMATROP MBR 5-1.5 MG/5ML PO SOLN: 5.0000 mL | Freq: Four times a day (QID) | ORAL | 0 refills | Status: DC | PRN

## 2022-09-02 NOTE — Progress Notes (Signed)
Reviewed written d/c instructions w pt's father and all questions answered. He verbalized understanding. D/C via w/c w all belongings in stable condition.

## 2022-09-02 NOTE — Discharge Summary (Signed)
Physician Discharge Summary  Roy Price ZOX:[id number] DOB: Dec 17, 1995 DOA: 08/28/2022  PCP: Maryellen Pile, MD (Inactive)  Admit date: 08/28/2022 Discharge date: 09/02/2022  Admitted From: Home Disposition: Home  Recommendations for Outpatient Follow-up:  Follow up with PCP in 1-2 weeks   Home Health: N/A Equipment/Devices: N/A  Discharge Condition: Stable CODE STATUS: Full code Diet recommendation: Regular diet  Discharge summary: 26 year old with history of autism and seizure disorder presented with cough and fever from home.  Patient was tested positive for influenza B.  A chest x-ray as well as CT scan of the chest was consistent with multifocal pneumonia.  Patient initially on 40 L of oxygen and admitted to stepdown unit.  Ultimately improved and now on room air.  Treated with IV antibiotics with Rocephin, doxycycline and Tamiflu.  Acute hypoxemic respiratory failure secondary to influenza B and suspected superimposed bacterial pneumonia: Treated with Rocephin and doxycycline.  Aspiration precautions.  Clinical aspiration ruled out.  Seen by speech therapy. He will continue at home chest physiotherapy, incentive spirometry, deep breathing exercises, sputum induction, mucolytic's. Sputum cultures, blood cultures, negative so far. Completed 5 days of Tamiflu. Mobilized in the hallway, 89% on room air. Chest x-ray with some pleural effusion, likely transudative.  Without leukocytosis or fever. Discharge home with 5 additional days of doxycycline, cough medications and chest physio   Severe sepsis present on admission secondary to above. Resolved.    Seizure disorder with underlying autism: Neurology as outpatient.  On Vimpat.  No evidence of new seizure.   Appropriately improving.  Clinically stabilizing to discharge home.  Discharge Diagnoses:  Principal Problem:   Acute respiratory failure with hypoxia (HCC) Active Problems:   Autism   Seizures (HCC)   Severe  sepsis (HCC)   Influenza B   Aspiration pneumonia [facility])    Discharge Instructions  Discharge Instructions     Call MD for:  difficulty breathing, headache or visual disturbances   Complete by: As directed    Diet general   Complete by: As directed    Increase activity slowly   Complete by: As directed       Allergies as of 09/02/2022       Reactions   Dairy Aid [tilactase] Rash   Opioid effect         Medication List     TAKE these medications    ascorbic acid 500 MG tablet Commonly known as: VITAMIN C Take 500 mg by mouth daily.   cholecalciferol 10 MCG (400 UNIT) Tabs tablet Commonly known as: VITAMIN D3 Take 2,000 Units by mouth at bedtime.   co-enzyme Q-10 30 MG capsule Take 30 mg by mouth at bedtime.   doxycycline 100 MG tablet Commonly known as: VIBRA-TABS Take 1 tablet (100 mg total) by mouth every 12 (twelve) hours for 5 days.   FISH OIL PO Take 1 capsule by mouth daily. gummies   guaiFENesin 600 MG 12 hr tablet Commonly known as: Mucinex Take 1 tablet (600 mg total) by mouth 2 (two) times daily for 14 days.   HYDROcodone bit-homatropine 5-1.5 MG/5ML syrup Commonly known as: HYCODAN Take 5 mLs by mouth every 6 (six) hours as needed for cough.   Vimpat 100 MG Tabs Generic drug: Lacosamide Take 150-300 mg by mouth See admin instructions. Take 300 mg by mouth in the morning and then take 150 mg in the evening   vitamin E 180 MG (400 UNITS) capsule Take 400 Units by mouth daily.  Allergies  Allergen Reactions   Dairy Aid [Tilactase] Rash    Opioid effect     Consultations: None     Procedures/Studies: DG CHEST PORT 1 VIEW  Result Date: 08/31/2022 CLINICAL DATA:  Respiratory failure EXAM: PORTABLE CHEST 1 VIEW COMPARISON:  Previous studies including the examination done on 08/28/2022 FINDINGS: Transverse diameter of heart is slightly increased. Central pulmonary vessels are prominent. There is marked interval increase in  opacification in both lower lung fields. Upper lung fields are clear. There is blunting of both lateral CP angles. There is no pneumothorax. IMPRESSION: There is significant increase in opacification in both lower lung fields suggesting moderate bilateral pleural effusions and worsening of underlying multifocal pneumonia. Electronically Signed   By: Ernie Avena M.D.   On: 08/31/2022 11:20   CT Angio Chest Pulmonary Embolism (PE) W or WO Contrast  Result Date: 08/28/2022 CLINICAL DATA:  Intermediate clinical probability for pulmonary embolism. EXAM: CT ANGIOGRAPHY CHEST WITH CONTRAST TECHNIQUE: Multidetector CT imaging of the chest was performed using the standard protocol during bolus administration of intravenous contrast. Multiplanar CT image reconstructions and MIPs were obtained to evaluate the vascular anatomy. RADIATION DOSE REDUCTION: This exam was performed according to the departmental dose-optimization program which includes automated exposure control, adjustment of the mA and/or kV according to patient size and/or use of iterative reconstruction technique. CONTRAST:  64mL OMNIPAQUE IOHEXOL 350 MG/ML SOLN COMPARISON:  None Available. FINDINGS: Cardiovascular: Satisfactory opacification of pulmonary arteries noted, however significant respiratory motion artifact limits evaluation of the lower lobe pulmonary arteries. No evidence of thoracic aortic dissection or aneurysm. Mediastinum/Nodes: Mild lymphadenopathy is seen in the subcarinal region measuring 1.5 cm. No other pathologically enlarged lymph nodes identified. Lungs/Pleura: Fluid is seen within the right mainstem and proximal right lower lobe bronchus. Bilateral lower lobe airspace disease is seen. Thin walled cystic lesion is seen within the right middle lobe which contains an air-fluid level, and measures 3.5 x 2.5 cm. No evidence of pleural effusion. Upper abdomen: No acute findings. Musculoskeletal: No suspicious bone lesions  identified. Review of the MIP images confirms the above findings. IMPRESSION: Respiratory motion artifacts lymph significantly limits sensitivity of this exam, however no definite evidence of pulmonary embolism identified. Bilateral lower lobe airspace disease, with fluid in right mainstem and proximal right lower lobe bronchus. This is highly suspicious for pneumonia, and aspiration cannot be excluded. 3.5 cm thin-walled cystic lesion in right middle lobe with air-fluid level, likely a fluid-filled bleb or pneumatocele. Mild subcarinal lymphadenopathy, which may be reactive in etiology. Recommend short-term follow-up by CT in several weeks, or as clinically warranted. Electronically Signed   By: Danae Orleans M.D.   On: 08/28/2022 09:31   DG Chest Port 1 View  Result Date: 08/28/2022 CLINICAL DATA:  Questionable sepsis, cough and fever for 2 days EXAM: PORTABLE CHEST 1 VIEW COMPARISON:  03/31/2022 FINDINGS: Clearing of right pneumonia on prior. Airway thickening bilaterally. No edema, effusion, or pneumothorax. Normal heart size. IMPRESSION: Bronchitic airway thickening.  No focal pneumonia. Electronically Signed   By: Tiburcio Pea M.D.   On: 08/28/2022 06:43   (Echo, Carotid, EGD, Colonoscopy, ERCP)    Subjective: Patient seen and examined.  Father at the bedside.  Patient has some cough and was with mild distress after walking around in the hallway.  Unable to produce any phlegm. afebrile. Patient's father is comfortable taking him home and take care of him at home along with doing chest physiotherapy and exercises.   Discharge Exam: Vitals:  09/02/22 0923 09/02/22 1343  BP: 119/68 115/71  Pulse: 74 79  Resp: 17 17  Temp: 98.3 F (36.8 C) (!) 97.5 F (36.4 C)  SpO2: 94% 94%   Vitals:   09/02/22 0240 09/02/22 0521 09/02/22 0923 09/02/22 1343  BP: 117/77 116/69 119/68 115/71  Pulse: 70 66 74 79  Resp: 16 16 17 17   Temp: 97.6 F (36.4 C) (!) 97.4 F (36.3 C) 98.3 F (36.8 C) (!)  97.5 F (36.4 C)  TempSrc: Oral Oral Oral Oral  SpO2: 93% 94% 94% 94%  Weight:      Height:        General: Pt is alert, awake, not in acute distress On room air.  Flat affect.  Answers simple single words.  Cardiovascular: RRR, S1/S2 +, no rubs, no gallops Respiratory: conducted upper airway sounds, mild distress after walking.  Abdominal: Soft, NT, ND, bowel sounds + Extremities: no edema, no cyanosis    The results of significant diagnostics from this hospitalization (including imaging, microbiology, ancillary and laboratory) are listed below for reference.     Microbiology: Recent Results (from the past 240 hour(s))  Blood Culture (routine x 2)     Status: None   Collection Time: 08/28/22  6:30 AM   Specimen: BLOOD RIGHT FOREARM  Result Value Ref Range Status   Specimen Description   Final    BLOOD RIGHT FOREARM Performed at [facility], [address] [place], [place]    Special Requests   Final    BOTTLES DRAWN AEROBIC AND ANAEROBIC Blood Culture results may not be optimal due to an excessive volume of blood received in culture bottles Performed at [facility], [address] [place], [place]    Culture   Final    NO GROWTH 5 DAYS Performed at [facility], [address], [place], [place]    Report Status 09/02/2022 FINAL  Final  Resp panel by RT-PCR (RSV, Flu A&B, Covid) Anterior Nasal Swab     Status: Abnormal   Collection Time: 08/28/22  6:33 AM   Specimen: Anterior Nasal Swab  Result Value Ref Range Status   SARS Coronavirus 2 by RT PCR NEGATIVE NEGATIVE Final    Comment: (NOTE) SARS-CoV-2 target nucleic acids are NOT DETECTED.  The SARS-CoV-2 RNA is generally detectable in upper respiratory specimens during the acute phase of infection. The lowest concentration of SARS-CoV-2 viral copies this assay can detect is 138 copies/mL. A negative result does not preclude SARS-Cov-2 infection and  should not be used as the sole basis for treatment or other patient management decisions. A negative result may occur with  improper specimen collection/handling, submission of specimen other than nasopharyngeal swab, presence of viral mutation(s) within the areas targeted by this assay, and inadequate number of viral copies(<138 copies/mL). A negative result must be combined with clinical observations, patient history, and epidemiological information. The expected result is Negative.  Fact Sheet for Patients:  08/30/22  Fact Sheet for Healthcare Providers:  BloggerCourse.com  This test is no t yet approved or cleared by the [place] FDA and  has been authorized for detection and/or diagnosis of SARS-CoV-2 by FDA under an Emergency Use Authorization (EUA). This EUA will remain  in effect (meaning this test can be used) for the duration of the COVID-19 declaration under Section 564(b)(1) of the Act, 21 U.S.C.section 360bbb-3(b)(1), unless the authorization is terminated  or revoked sooner.  Influenza A by PCR NEGATIVE NEGATIVE Final   Influenza B by PCR POSITIVE (A) NEGATIVE Final    Comment: (NOTE) The Xpert Xpress SARS-CoV-2/FLU/RSV plus assay is intended as an aid in the diagnosis of influenza from Nasopharyngeal swab specimens and should not be used as a sole basis for treatment. Nasal washings and aspirates are unacceptable for Xpert Xpress SARS-CoV-2/FLU/RSV testing.  Fact Sheet for Patients: BloggerCourse.comhttps://www.fda.gov/media/152166/download  Fact Sheet for Healthcare Providers: SeriousBroker.ithttps://www.fda.gov/media/152162/download  This test is not yet approved or cleared by the [place] FDA and has been authorized for detection and/or diagnosis of SARS-CoV-2 by FDA under an Emergency Use Authorization (EUA). This EUA will remain in effect (meaning this test can be used) for the duration of the COVID-19 declaration  under Section 564(b)(1) of the Act, 21 U.S.C. section 360bbb-3(b)(1), unless the authorization is terminated or revoked.     Resp Syncytial Virus by PCR NEGATIVE NEGATIVE Final    Comment: (NOTE) Fact Sheet for Patients: BloggerCourse.comhttps://www.fda.gov/media/152166/download  Fact Sheet for Healthcare Providers: SeriousBroker.ithttps://www.fda.gov/media/152162/download  This test is not yet approved or cleared by the [place] FDA and has been authorized for detection and/or diagnosis of SARS-CoV-2 by FDA under an Emergency Use Authorization (EUA). This EUA will remain in effect (meaning this test can be used) for the duration of the COVID-19 declaration under Section 564(b)(1) of the Act, 21 U.S.C. section 360bbb-3(b)(1), unless the authorization is terminated or revoked.  Performed at [facility] [place], [address] [place], [place] [address]   MRSA Next Gen by PCR, Nasal     Status: None   Collection Time: 08/28/22  5:11 PM   Specimen: Nasal Mucosa; Nasal Swab  Result Value Ref Range Status   MRSA by PCR Next Gen NOT DETECTED NOT DETECTED Final    Comment: (NOTE) The GeneXpert MRSA Assay (FDA approved for NASAL specimens only), is one component of a comprehensive MRSA colonization surveillance program. It is not intended to diagnose MRSA infection nor to guide or monitor treatment for MRSA infections. Test performance is not FDA approved in patients less than 27 years old. Performed at [facility], [address] [place], [place] [address]      Labs: BNP (last 3 results) No results for input(s): "BNP" in the last 8760 hours. Basic Metabolic Panel: Recent Labs  Lab 08/28/22 0630 08/29/22 0317 09/01/22 0330  NA 138 140 139  K 3.8 4.1 3.4*  CL 101 106 106  CO2 23 26 25   GLUCOSE 117* 117* 101*  BUN 18 18 13   CREATININE 1.11 0.94 0.66  CALCIUM 9.0 8.3* 8.4*  MG 2.0  --   --   PHOS 3.6  --   --    Liver Function Tests: Recent Labs  Lab 08/28/22 0630   AST 34  ALT 20  ALKPHOS 64  BILITOT 0.9  PROT 7.9  ALBUMIN 4.3   No results for input(s): "LIPASE", "AMYLASE" in the last 168 hours. No results for input(s): "AMMONIA" in the last 168 hours. CBC: Recent Labs  Lab 08/28/22 0630 08/29/22 0317 09/01/22 0330  WBC 11.0* 10.2 4.3  NEUTROABS 9.0*  --   --   HGB 16.9 14.3 13.2  HCT 48.6 41.7 39.0  MCV 94.6 96.8 96.8  PLT 111* 102* 179   Cardiac Enzymes: No results for input(s): "CKTOTAL", "CKMB", "CKMBINDEX", "TROPONINI" in the last 168 hours. BNP: Invalid input(s): "POCBNP" CBG: No results for input(s): "GLUCAP" in the last 168 hours. D-Dimer No results for input(s): "DDIMER"  in the last 72 hours. Hgb A1c No results for input(s): "HGBA1C" in the last 72 hours. Lipid Profile No results for input(s): "CHOL", "HDL", "LDLCALC", "TRIG", "CHOLHDL", "LDLDIRECT" in the last 72 hours. Thyroid function studies No results for input(s): "TSH", "T4TOTAL", "T3FREE", "THYROIDAB" in the last 72 hours.  Invalid input(s): "FREET3" Anemia work up No results for input(s): "VITAMINB12", "FOLATE", "FERRITIN", "TIBC", "IRON", "RETICCTPCT" in the last 72 hours. Urinalysis    Component Value Date/Time   COLORURINE YELLOW 07/24/2018 1657   APPEARANCEUR CLEAR 07/24/2018 1657   LABSPEC 1.015 07/24/2018 1657   PHURINE 6.0 07/24/2018 1657   GLUCOSEU NEGATIVE 07/24/2018 1657   HGBUR NEGATIVE 07/24/2018 1657   BILIRUBINUR NEGATIVE 07/24/2018 1657   KETONESUR NEGATIVE 07/24/2018 1657   PROTEINUR NEGATIVE 07/24/2018 1657   NITRITE NEGATIVE 07/24/2018 1657   LEUKOCYTESUR NEGATIVE 07/24/2018 1657   Sepsis Labs Recent Labs  Lab 08/28/22 0630 08/29/22 0317 09/01/22 0330  WBC 11.0* 10.2 4.3   Microbiology Recent Results (from the past 240 hour(s))  Blood Culture (routine x 2)     Status: None   Collection Time: 08/28/22  6:30 AM   Specimen: BLOOD RIGHT FOREARM  Result Value Ref Range Status   Specimen Description   Final    BLOOD RIGHT  FOREARM Performed at [facility], [address] [place], [place] [address]    Special Requests   Final    BOTTLES DRAWN AEROBIC AND ANAEROBIC Blood Culture results may not be optimal due to an excessive volume of blood received in culture bottles Performed at [facility], [address] [place] [address] [place], [place] [address]    Culture   Final    NO GROWTH 5 DAYS Performed at [facility], [address] [place], [place] [address]    Report Status 09/02/2022 FINAL  Final  Resp panel by RT-PCR (RSV, Flu A&B, Covid) Anterior Nasal Swab     Status: Abnormal   Collection Time: 08/28/22  6:33 AM   Specimen: Anterior Nasal Swab  Result Value Ref Range Status   SARS Coronavirus 2 by RT PCR NEGATIVE NEGATIVE Final    Comment: (NOTE) SARS-CoV-2 target nucleic acids are NOT DETECTED.  The SARS-CoV-2 RNA is generally detectable in upper respiratory specimens during the acute phase of infection. The lowest concentration of SARS-CoV-2 viral copies this assay can detect is 138 copies/mL. A negative result does not preclude SARS-Cov-2 infection and should not be used as the sole basis for treatment or other patient management decisions. A negative result may occur with  improper specimen collection/handling, submission of specimen other than nasopharyngeal swab, presence of viral mutation(s) within the areas targeted by this assay, and inadequate number of viral copies(<138 copies/mL). A negative result must be combined with clinical observations, patient history, and epidemiological information. The expected result is Negative.  Fact Sheet for Patients:  BloggerCourse.com  Fact Sheet for Healthcare Providers:  SeriousBroker.it  This test is no t yet approved or cleared by the [place] FDA and  has been authorized for detection and/or diagnosis of SARS-CoV-2 by FDA under an Emergency Use Authorization (EUA). This  EUA will remain  in effect (meaning this test can be used) for the duration of the COVID-19 declaration under Section 564(b)(1) of the Act, 21 U.S.C.section 360bbb-3(b)(1), unless the authorization is terminated  or revoked sooner.       Influenza A by PCR NEGATIVE NEGATIVE Final   Influenza B by PCR POSITIVE (A) NEGATIVE Final  Comment: (NOTE) The Xpert Xpress SARS-CoV-2/FLU/RSV plus assay is intended as an aid in the diagnosis of influenza from Nasopharyngeal swab specimens and should not be used as a sole basis for treatment. Nasal washings and aspirates are unacceptable for Xpert Xpress SARS-CoV-2/FLU/BloggerCourse.com https://www.fda.gov/media/152166/download  Fact Sheet for Healthcare Providers: SeriousBroker.it  This test is not yet approved or cleared by the [place] FDA and has been authorized for detection and/or diagnosis of SARS-CoV-2 by FDA under an Emergency Use Authorization (EUA). This EUA will remain in effect (meaning this test can be used) for the duration of the COVID-19 declaration under Section 564(b)(1) of the Act, 21 U.S.C. section 360bbb-3(b)(1), unless the authorization is terminated or revoked.     Resp Syncytial Virus by PCR NEGATIVE NEGATIVE Final    Comment: (NOTE) Fact Sheet for Patients: BloggerCourse.com  Fact Sheet for Healthcare Providers: SeriousBroker.it  This test is not yet approved or cleared by the [place] FDA and has been authorized for detection and/or diagnosis of SARS-CoV-2 by FDA under an Emergency Use Authorization (EUA). This EUA will remain in effect (meaning this test can be used) for the duration of the COVID-19 declaration under Section 564(b)(1) of the Act, 21 U.S.C. section 360bbb-3(b)(1), unless the authorization is terminated or revoked.  Performed at [facility], [address]  [place], [place] [address]   MRSA Next Gen by PCR, Nasal     Status: None   Collection Time: 08/28/22  5:11 PM   Specimen: Nasal Mucosa; Nasal Swab  Result Value Ref Range Status   MRSA by PCR Next Gen NOT DETECTED NOT DETECTED Final    Comment: (NOTE) The GeneXpert MRSA Assay (FDA approved for NASAL specimens only), is one component of a comprehensive MRSA colonization surveillance program. It is not intended to diagnose MRSA infection nor to guide or monitor treatment for MRSA infections. Test performance is not FDA approved in patients less than 48 years old. Performed at [facility], [address] [place], [place] [address]      Time coordinating discharge:  35 minutes  SIGNED:   Dorcas Carrow, MD  [facility] 09/02/2022, 1:47 PM

## 2022-09-02 NOTE — Progress Notes (Signed)
Mobility Specialist - Progress Note   09/02/22 1220  Oxygen Therapy  O2 Device Room Air  Mobility  Activity Ambulated independently in hallway  Level of Assistance Independent  Assistive Device None  Distance Ambulated (ft) 250 ft  Activity Response Tolerated well  Mobility Referral Yes  $Mobility charge 1 Mobility   Nurse requested Mobility Specialist to perform oxygen saturation test with pt which includes removing pt from oxygen both at rest and while ambulating. Below are the results from that testing.     Patient Saturations on Room Air at Rest = spO2 90%  Patient Saturations on Room Air while Ambulating = sp02 89% .  Patient Saturations on 0 Liters of oxygen while Ambulating = sp02 89%  At end of testing pt left in room on 0  Liters of oxygen.  Reported results to nurse.   Pt received in bed and agreeable to mobility. No complaints during mobility. Pt to bed after session with all needs met.    During mobility: 89% SpO2  Set designer

## 2022-09-08 ENCOUNTER — Encounter (HOSPITAL_BASED_OUTPATIENT_CLINIC_OR_DEPARTMENT_OTHER): Payer: Self-pay | Admitting: Surgery

## 2022-09-08 DIAGNOSIS — R2230 Localized swelling, mass and lump, unspecified upper limb: Secondary | ICD-10-CM | POA: Diagnosis present

## 2022-09-08 NOTE — H&P (Signed)
REFERRING PHYSICIAN: Self/parents  PROVIDER: Kassia Demarinis Charlotta Newton, MD   Chief Complaint: New Consultation (Mass right posterior shoulder)  History of Present Illness:  Patient is referred by his primary care physician, Dr. Okey Dupre, for surgical evaluation and management of a soft tissue mass on the posterior right shoulder. Patient is accompanied by his mother and father. He has severe autism. Family notes that the mass has been present for approximately 4 years and gradually increasing in size. There is no history of infection or drainage. Patient has had no other such lesions removed. Patient does not have any significant pain. He presents today for consideration for surgical excision.  Review of Systems: A complete review of systems was obtained from the patient. I have reviewed this information and discussed as appropriate with the patient. See HPI as well for other ROS.  Review of Systems  Constitutional: Negative.  HENT: Negative.  Eyes: Negative.  Respiratory: Negative.  Cardiovascular: Negative.  Gastrointestinal: Negative.  Genitourinary: Negative.  Musculoskeletal: Negative.  Skin: Negative.  Neurological: Positive for seizures.  Endo/Heme/Allergies: Negative.  Psychiatric/Behavioral: Negative.    Medical History: Past Medical History:  Diagnosis Date  Seizures (CMS-HCC)   Patient Active Problem List  Diagnosis  Mass of skin of right shoulder   Past Surgical History:  Procedure Laterality Date  CRANIOTOMY EXPLORATORY  2002- epilepsy    Allergies  Allergen Reactions  Milk Rash   Current Outpatient Medications on File Prior to Visit  Medication Sig Dispense Refill  lacosamide (VIMPAT) 200 mg tablet Take 300 mg by mouth every 12 (twelve) hours   No current facility-administered medications on file prior to visit.   Family History  Problem Relation Age of Onset  Hyperlipidemia (Elevated cholesterol) Mother  High blood pressure  (Hypertension) Father  Hyperlipidemia (Elevated cholesterol) Father  Deep vein thrombosis (DVT or abnormal blood clot formation) Father    Social History   Tobacco Use  Smoking Status Never  Smokeless Tobacco Never    Social History   Socioeconomic History  Marital status: Single  Tobacco Use  Smoking status: Never  Smokeless tobacco: Never  Vaping Use  Vaping Use: Never used  Substance and Sexual Activity  Alcohol use: Never  Drug use: Never   Objective:   Vitals:  BP: 120/62  Pulse: 79  Temp: 36.7 C (98.1 F)  SpO2: 99%  Weight: 55.9 kg (123 lb 3.2 oz)  Height: 177.8 cm (5\' 10" )   Body mass index is 17.68 kg/m.  Physical Exam   GENERAL APPEARANCE Comfortable, no acute issues Development: normal Gross deformities: none  SKIN Rash, lesions, ulcers: none Induration, erythema: none Nodules: On the posterior right shoulder at the upper portion of the scapula is a soft tissue subcutaneous mass measuring 3 x 2.5 x 2 cm consistent with an epidermal inclusion cyst or sebaceous cyst. There is no sign of infection or inflammation. It is nontender.  EYES Conjunctiva and lids: normal Pupils: equal and reactive  EARS, NOSE, MOUTH, THROAT External ears: no lesion or deformity External nose: no lesion or deformity Hearing: grossly normal  NECK Symmetric: yes Trachea: midline Thyroid: no palpable nodules in the thyroid bed  CHEST Respiratory effort: normal Retraction or accessory muscle use: no Breath sounds: normal bilaterally Rales, rhonchi, wheeze: none  CARDIOVASCULAR Auscultation: regular rhythm, normal rate Murmurs: none Pulses: radial pulse 2+ palpable Lower extremity edema: none  ABDOMEN Not assessed  GENITOURINARY/RECTAL Not assessed  LYMPHATIC Cervical: none palpable Supraclavicular: none palpable   Assessment and  Plan:   Mass of skin of right shoulder  Patient is referred by his primary care physician for surgical evaluation and  management of a soft tissue mass on the right posterior shoulder. On examination today this is likely an epidermal inclusion cyst or sebaceous cyst. It is never been infected but it is gradually increasing in size and now measures at least 3 cm in greatest dimension. The patient's mother and father are present during the examination and wish to proceed with surgical excision as an outpatient procedure.  We discussed surgical excision as an outpatient surgical procedure. We would likely do this under sedation and local anesthetic. I think it would be a good choice to do this at the [facility]. We will coordinate a time for the procedure that is convenient for the patient and his family. We discussed the size and location of the incision. We discussed placing sutures which would be absorbable. We will use Dermabond for dressing. The patient and his family understand and wish to proceed.   Armandina Gemma, MD [facility] A [place] practice Office: [phone]

## 2022-09-13 ENCOUNTER — Encounter (HOSPITAL_BASED_OUTPATIENT_CLINIC_OR_DEPARTMENT_OTHER): Payer: Self-pay | Admitting: Surgery

## 2022-09-13 NOTE — Progress Notes (Signed)
Spoke w/ via phone for pre-op interview--- Darrick Penna (patients mother) Lab needs dos----  NONE             Lab results------ COVID test -----patient states asymptomatic no test needed Arrive at -------0745 NPO after MN NO Solid Food.  Med rec completed Medications to take morning of surgery ----- Vimpat Diabetic medication ----- Patient instructed no nail polish to be worn day of surgery Patient instructed to bring photo id and insurance card day of surgery Patient aware to have Driver (ride ) / caregiver  Jacqulynn Cadet and Brooke Bonito, mother and father  for 24 hours after surgery  Patient Special Instructions ----- Pre-Op special Istructions ----- Patient verbalized understanding of instructions that were given at this phone interview. Patient denies shortness of breath, chest pain, fever, cough at this phone interview.  Patient is severely autistic and non-verbal, parents will need to go to preop with patient. Mother stated patient should be ok with IV start in preop.

## 2022-09-14 ENCOUNTER — Other Ambulatory Visit: Payer: Self-pay

## 2022-09-14 ENCOUNTER — Encounter (HOSPITAL_BASED_OUTPATIENT_CLINIC_OR_DEPARTMENT_OTHER)
Admission: RE | Disposition: A | Discharge status: Home / Self Care | Payer: Self-pay | Source: Home / Self Care | Attending: Surgery

## 2022-09-14 ENCOUNTER — Ambulatory Visit (HOSPITAL_BASED_OUTPATIENT_CLINIC_OR_DEPARTMENT_OTHER)
Admission: RE | Admit: 2022-09-14 | Discharge: 2022-09-14 | Disposition: A | Discharge status: Home / Self Care | Payer: BC Managed Care – PPO | Attending: Surgery | Admitting: Surgery

## 2022-09-14 ENCOUNTER — Encounter (HOSPITAL_BASED_OUTPATIENT_CLINIC_OR_DEPARTMENT_OTHER): Payer: Self-pay | Admitting: Surgery

## 2022-09-14 ENCOUNTER — Ambulatory Visit (HOSPITAL_BASED_OUTPATIENT_CLINIC_OR_DEPARTMENT_OTHER): Payer: BC Managed Care – PPO

## 2022-09-14 DIAGNOSIS — F840 Autistic disorder: Secondary | ICD-10-CM | POA: Diagnosis not present

## 2022-09-14 DIAGNOSIS — R2230 Localized swelling, mass and lump, unspecified upper limb: Secondary | ICD-10-CM | POA: Diagnosis present

## 2022-09-14 DIAGNOSIS — G40909 Epilepsy, unspecified, not intractable, without status epilepticus: Secondary | ICD-10-CM | POA: Diagnosis not present

## 2022-09-14 DIAGNOSIS — L720 Epidermal cyst: Secondary | ICD-10-CM | POA: Insufficient documentation

## 2022-09-14 DIAGNOSIS — R2231 Localized swelling, mass and lump, right upper limb: Secondary | ICD-10-CM | POA: Diagnosis not present

## 2022-09-14 HISTORY — PX: MASS EXCISION: SHX2000

## 2022-09-14 SURGERY — EXCISION MASS
Anesthesia: Monitor Anesthesia Care | Laterality: Right

## 2022-09-14 MED ORDER — CHLORHEXIDINE GLUCONATE CLOTH 2 % EX PADS: 6.0000 | MEDICATED_PAD | Freq: Once | CUTANEOUS | Status: DC

## 2022-09-14 MED ORDER — BUPIVACAINE HCL 0.5 % IJ SOLN
INTRAMUSCULAR | Status: DC | PRN
  Administered 2022-09-14: 16 mL

## 2022-09-14 MED ORDER — LIDOCAINE 2% (20 MG/ML) 5 ML SYRINGE
INTRAMUSCULAR | Status: DC | PRN
  Administered 2022-09-14: 40 mg via INTRAVENOUS

## 2022-09-14 MED ORDER — MIDAZOLAM HCL 2 MG/2ML IJ SOLN
INTRAMUSCULAR | Status: AC
  Filled 2022-09-14: qty 2

## 2022-09-14 MED ORDER — TRAMADOL HCL 50 MG PO TABS: 50.0000 mg | ORAL_TABLET | Freq: Four times a day (QID) | ORAL | 0 refills | Status: DC | PRN

## 2022-09-14 MED ORDER — PROPOFOL 10 MG/ML IV BOLUS
INTRAVENOUS | Status: DC | PRN
  Administered 2022-09-14: 30 mg via INTRAVENOUS

## 2022-09-14 MED ORDER — MIDAZOLAM HCL 2 MG/2ML IJ SOLN
INTRAMUSCULAR | Status: DC | PRN
  Administered 2022-09-14: 2 mg via INTRAVENOUS

## 2022-09-14 MED ORDER — DEXMEDETOMIDINE HCL IN NACL 80 MCG/20ML IV SOLN
INTRAVENOUS | Status: DC | PRN
  Administered 2022-09-14: 4 ug via BUCCAL
  Administered 2022-09-14: 8 ug via BUCCAL

## 2022-09-14 MED ORDER — OXYCODONE HCL 5 MG/5ML PO SOLN: 5.0000 mg | Freq: Once | ORAL | Status: DC | PRN

## 2022-09-14 MED ORDER — FENTANYL CITRATE (PF) 100 MCG/2ML IJ SOLN: 25.0000 ug | INTRAMUSCULAR | Status: DC | PRN

## 2022-09-14 MED ORDER — CEFAZOLIN SODIUM-DEXTROSE 2-4 GM/100ML-% IV SOLN
INTRAVENOUS | Status: AC
  Filled 2022-09-14: qty 100

## 2022-09-14 MED ORDER — CEFAZOLIN SODIUM-DEXTROSE 2-4 GM/100ML-% IV SOLN
2.0000 g | INTRAVENOUS | Status: AC
  Administered 2022-09-14: 2 g via INTRAVENOUS

## 2022-09-14 MED ORDER — ACETAMINOPHEN 500 MG PO TABS: 1000.0000 mg | ORAL_TABLET | Freq: Once | ORAL | Status: DC

## 2022-09-14 MED ORDER — LACTATED RINGERS IV SOLN: INTRAVENOUS | Status: DC

## 2022-09-14 MED ORDER — PROPOFOL 500 MG/50ML IV EMUL
INTRAVENOUS | Status: DC | PRN
  Administered 2022-09-14: 200 ug/kg/min via INTRAVENOUS

## 2022-09-14 MED ORDER — 0.9 % SODIUM CHLORIDE (POUR BTL) OPTIME
TOPICAL | Status: DC | PRN
  Administered 2022-09-14: 500 mL

## 2022-09-14 MED ORDER — PROPOFOL 1000 MG/100ML IV EMUL
INTRAVENOUS | Status: AC
  Filled 2022-09-14: qty 100

## 2022-09-14 MED ORDER — CELECOXIB 200 MG PO CAPS: 200.0000 mg | ORAL_CAPSULE | Freq: Once | ORAL | Status: DC

## 2022-09-14 MED ORDER — FENTANYL CITRATE (PF) 100 MCG/2ML IJ SOLN
INTRAMUSCULAR | Status: AC
  Filled 2022-09-14: qty 2

## 2022-09-14 MED ORDER — OXYCODONE HCL 5 MG PO TABS: 5.0000 mg | ORAL_TABLET | Freq: Once | ORAL | Status: DC | PRN

## 2022-09-14 MED ORDER — FENTANYL CITRATE (PF) 100 MCG/2ML IJ SOLN
INTRAMUSCULAR | Status: DC | PRN
  Administered 2022-09-14: 50 ug via INTRAVENOUS

## 2022-09-14 SURGICAL SUPPLY — 39 items
ADH SKN CLS APL DERMABOND .7 (GAUZE/BANDAGES/DRESSINGS) ×2
APL PRP STRL LF DISP 70% ISPRP (MISCELLANEOUS) ×1
APL SKNCLS STERI-STRIP NONHPOA (GAUZE/BANDAGES/DRESSINGS) ×1
BENZOIN TINCTURE PRP APPL 2/3 (GAUZE/BANDAGES/DRESSINGS) ×1 IMPLANT
BLADE SURG 15 STRL LF DISP TIS (BLADE) ×1 IMPLANT
BLADE SURG 15 STRL SS (BLADE) ×1
CHLORAPREP W/TINT 26 (MISCELLANEOUS) ×1 IMPLANT
CLEANER CAUTERY TIP PAD (MISCELLANEOUS) IMPLANT
COVER BACK TABLE 60X90IN (DRAPES) ×1 IMPLANT
COVER MAYO STAND STRL (DRAPES) ×1 IMPLANT
DERMABOND ADVANCED .7 DNX12 (GAUZE/BANDAGES/DRESSINGS) IMPLANT
DRAPE LAPAROTOMY 100X72 PEDS (DRAPES) IMPLANT
DRAPE SHEET LG 3/4 BI-LAMINATE (DRAPES) IMPLANT
DRAPE UTILITY XL STRL (DRAPES) ×1 IMPLANT
DRSG TEGADERM 2-3/8X2-3/4 SM (GAUZE/BANDAGES/DRESSINGS) IMPLANT
DRSG TEGADERM 4X4.75 (GAUZE/BANDAGES/DRESSINGS) IMPLANT
ELECT REM PT RETURN 9FT ADLT (ELECTROSURGICAL) ×1
ELECTRODE REM PT RTRN 9FT ADLT (ELECTROSURGICAL) ×1 IMPLANT
GAUZE 4X4 16PLY ~~LOC~~+RFID DBL (SPONGE) ×1 IMPLANT
GAUZE SPONGE 4X4 12PLY STRL (GAUZE/BANDAGES/DRESSINGS) ×1 IMPLANT
GLOVE SURG ORTHO 8.0 STRL STRW (GLOVE) ×1 IMPLANT
GOWN STRL REUS W/TWL LRG LVL3 (GOWN DISPOSABLE) ×1 IMPLANT
KIT TURNOVER CYSTO (KITS) ×1 IMPLANT
NDL HYPO 25X1 1.5 SAFETY (NEEDLE) ×1 IMPLANT
NEEDLE HYPO 25X1 1.5 SAFETY (NEEDLE) ×1 IMPLANT
NS IRRIG 500ML POUR BTL (IV SOLUTION) ×1 IMPLANT
PACK BASIN DAY SURGERY FS (CUSTOM PROCEDURE TRAY) ×1 IMPLANT
PENCIL SMOKE EVACUATOR (MISCELLANEOUS) ×1 IMPLANT
STRIP CLOSURE SKIN 1/2X4 (GAUZE/BANDAGES/DRESSINGS) ×1 IMPLANT
SUT ETHILON 3 0 PS 1 (SUTURE) IMPLANT
SUT ETHILON 4 0 PS 2 18 (SUTURE) IMPLANT
SUT MNCRL AB 4-0 PS2 18 (SUTURE) ×1 IMPLANT
SUT VIC AB 3-0 SH 18 (SUTURE) IMPLANT
SUT VIC AB 4-0 PS2 18 (SUTURE) IMPLANT
SUT VICRYL 3-0 CR8 SH (SUTURE) ×1 IMPLANT
SYR CONTROL 10ML LL (SYRINGE) ×1 IMPLANT
TOWEL OR 17X26 10 PK STRL BLUE (TOWEL DISPOSABLE) ×1 IMPLANT
TUBE CONNECTING 12X1/4 (SUCTIONS) IMPLANT
WATER STERILE IRR 500ML POUR (IV SOLUTION) IMPLANT

## 2022-09-14 NOTE — Interval H&P Note (Signed)
History and Physical Interval Note:  09/14/2022 9:08 AM  Roy Price  has presented today for surgery, with the diagnosis of MASS RIGHT POSTERIOR SHOULDER.  The various methods of treatment have been discussed with the patient and family. After consideration of risks, benefits and other options for treatment, the patient has consented to    Procedure(s): EXCISION MASS RIGHT SHOULDER (Right) as a surgical intervention.    The patient's history has been reviewed, patient examined, no change in status, stable for surgery.  I have reviewed the patient's chart and labs.  Questions were answered to the patient's satisfaction.    Armandina Gemma, [facility] [place] practice Office: [place]

## 2022-09-14 NOTE — Discharge Instructions (Addendum)
  [facility] [place] [facility] -- DISCHARGE INSTRUCTIONS  REMINDER:   Carry a list of your medications and allergies with you at all times  Call your pharmacy at least 1 week in advance to refill prescriptions  Do not mix any prescribed pain medicine with alcohol  Do not drive any motor vehicles while taking pain medication  Take medications with food unless otherwise directed  Follow-up appointments (date to return to physician): Please call [phone] to confirm your follow up appointment with your surgeon.  Call your Surgeon if you have:  Temperature greater than 101.0  Persistent nausea and vomiting  Severe uncontrolled pain  Redness, tenderness, or signs of infection (pain, swelling, redness, odor or green/yellow discharge around the site)  Difficulty breathing, headache or visual disturbances  Hives  Persistent dizziness or light-headedness  Any other questions or concerns you may have after discharge  In an emergency, call 911 or go to an Emergency Department at a nearby hospital.  Diet: Begin with liquids, and if they are tolerated, resume your usual diet.  Avoid spicy, greasy or heavy foods.  If you have nausea or vomiting, go back to liquids.  If you cannot keep liquids down, call your doctor.  Avoid alcohol consumption while on prescription pain medications. Good nutrition promotes healing. Increase fiber and fluids.   ADDITIONAL INSTRUCTIONS: Leave Dermabond in place for 7-10 days.  Ice packs to wound for first 48 hours and then as needed.  May shower.  [facility] Office: [place] Instructions  Activity: Get plenty of rest for the remainder of the day. A responsible individual must stay with you for 24 hours following the procedure.  For the next 24 hours, DO NOT: -Drive a car -Paediatric nurse -Drink alcoholic beverages -Take any medication unless instructed by your physician -Make any legal decisions or sign  important papers.  Meals: Start with liquid foods such as gelatin or soup. Progress to regular foods as tolerated. Avoid greasy, spicy, heavy foods. If nausea and/or vomiting occur, drink only clear liquids until the nausea and/or vomiting subsides. Call your physician if vomiting continues.  Special Instructions/Symptoms: Your throat may feel dry or sore from the anesthesia or the breathing tube placed in your throat during surgery. If this causes discomfort, gargle with warm salt water. The discomfort should disappear within 24 hours.

## 2022-09-14 NOTE — Anesthesia Preprocedure Evaluation (Addendum)
Anesthesia Evaluation  Patient identified by MRN, date of birth, ID band Patient awake    Reviewed: Allergy & Precautions, NPO status , Patient's Chart, lab work & pertinent test results  Airway Mallampati: II  TM Distance: >3 FB Neck ROM: Full    Dental no notable dental hx.    Pulmonary neg pulmonary ROS   Pulmonary exam normal        Cardiovascular negative cardio ROS  Rhythm:Regular Rate:Normal     Neuro/Psych Seizures -,  autism  negative psych ROS   GI/Hepatic negative GI ROS, Neg liver ROS,,,  Endo/Other  negative endocrine ROS    Renal/GU negative Renal ROS  negative genitourinary   Musculoskeletal Shoulder mass   Abdominal Normal abdominal exam  (+)   Peds  Hematology negative hematology ROS (+)   Anesthesia Other Findings   Reproductive/Obstetrics                             Anesthesia Physical Anesthesia Plan  ASA: 3  Anesthesia Plan: MAC   Post-op Pain Management: Ofirmev IV (intra-op)*   Induction: Intravenous  PONV Risk Score and Plan: 1 and Ondansetron, Dexamethasone, Midazolam, Treatment may vary due to age or medical condition and Propofol infusion  Airway Management Planned: Simple Face Mask, Natural Airway and Nasal Cannula  Additional Equipment: None  Intra-op Plan:   Post-operative Plan:   Informed Consent: I have reviewed the patients History and Physical, chart, labs and discussed the procedure including the risks, benefits and alternatives for the proposed anesthesia with the patient or authorized representative who has indicated his/her understanding and acceptance.     Dental advisory given and Consent reviewed with POA  Plan Discussed with:   Anesthesia Plan Comments:        Anesthesia Quick Evaluation

## 2022-09-14 NOTE — Transfer of Care (Signed)
Immediate Anesthesia Transfer of Care Note  Patient: Roy Price  Procedure(s) Performed: Procedure(s) (LRB): EXCISION MASS RIGHT SHOULDER (Right)  Patient Location: PACU  Anesthesia Type: MAC  Level of Consciousness: awake, alert , oriented and patient cooperative  Airway & Oxygen Therapy: Patient Spontanous Breathing and Patient connected to face mask oxygen  Post-op Assessment: Report given to PACU RN and Post -op Vital signs reviewed and stable  Post vital signs: Reviewed and stable  Complications: No apparent anesthesia complications Last Vitals:  Vitals Value Taken Time  BP 93/68 09/14/22 1033  Temp    Pulse 50 09/14/22 1044  Resp 13 09/14/22 1044  SpO2 100 % 09/14/22 1044  Vitals shown include unvalidated device data.  Last Pain:  Vitals:   09/14/22 0834  PainSc: 0-No pain      Patients Stated Pain Goal: 3 (81/15/72 6203)  Complications: No notable events documented.

## 2022-09-14 NOTE — Op Note (Signed)
Operative Note  Pre-operative Diagnosis:  epidermal inclusion cyst, right posterior shoulder  Post-operative Diagnosis:  same  Surgeon:  Armandina Gemma, MD  Assistant:  none   Procedure:  excision of epidermal inclusion cyst, right posterior shoulder, 2.5 x 2.0 x 2.0 cm  Anesthesia:  local with IV sedation  Estimated Blood Loss:  5 cc  Drains: none         Specimen: to pathology  Indications:  Patient is referred by his primary care physician, Dr. Okey Dupre, for surgical evaluation and management of a soft tissue mass on the posterior right shoulder. Patient is accompanied by his mother and father. He has severe autism. Family notes that the mass has been present for approximately 4 years and gradually increasing in size. There is no history of infection or drainage. Patient has had no other such lesions removed. Patient does not have any significant pain. He presents today for consideration for surgical excision.   Procedure:  The patient was seen in the pre-op holding area. The risks, benefits, complications, treatment options, and expected outcomes were previously discussed with the patient. The patient agreed with the proposed plan and has signed the informed consent form.  The patient was brought to the operating room by the surgical team, identified as Roy Price and the procedure verified. A "time out" was completed and the above information confirmed.  Following administration of intravenous sedation, the patient was turned to the left lateral decubitus position and properly padded and secured.  After ascertaining that an adequate level of sedation had been achieved, the patient was prepped and draped in usual aseptic fashion.  The previously marked lesion at the posterior right shoulder was then anesthetized with local anesthetic.  Using a #15 blade an elliptical incision was made so as to incorporate the overlying sinus tract.  Dissection was carried into the subcutaneous  tissue circumferentially.  Using the electrocautery for hemostasis, the entire mass was excised.  It measured 2.5 x 2.0 x 2.0 cm and was submitted to pathology for review.  It was consistent with an epidermal inclusion cyst.  Subcutaneous tissues were then closed with interrupted 3-0 Vicryl sutures.  Skin was closed with a running 4-0 Monocryl subcuticular suture.  Wound was washed and dried and Dermabond was applied as dressing.  Patient was awakened from anesthesia and transported to the recovery room in stable condition.  The patient tolerated the procedure well.   Armandina Gemma, [place] [facility] Office: [phone]

## 2022-09-14 NOTE — Anesthesia Postprocedure Evaluation (Signed)
Anesthesia Post Note  Patient: Roy Price  Procedure(s) Performed: EXCISION MASS RIGHT SHOULDER (Right)     Patient location during evaluation: PACU Anesthesia Type: MAC Level of consciousness: awake and alert Pain management: pain level controlled Vital Signs Assessment: post-procedure vital signs reviewed and stable Respiratory status: spontaneous breathing, nonlabored ventilation, respiratory function stable and patient connected to nasal cannula oxygen Cardiovascular status: stable and blood pressure returned to baseline Postop Assessment: no apparent nausea or vomiting Anesthetic complications: no   No notable events documented.  Last Vitals:  Vitals:   09/14/22 1115 09/14/22 1140  BP: 114/84 (!) 133/94  Pulse: (!) 52 (!) 58  Resp: 20 14  Temp:  36.6 C  SpO2: 100% 98%    Last Pain:  Vitals:   09/14/22 0834  PainSc: 0-No pain                 Belenda Cruise P Thelmer Legler

## 2022-09-14 NOTE — Anesthesia Procedure Notes (Signed)
Procedure Name: MAC Date/Time: 09/14/2022 9:37 AM  Performed by: Suan Halter, CRNAPre-anesthesia Checklist: Patient identified, Emergency Drugs available, Suction available, Patient being monitored and Timeout performed Patient Re-evaluated:Patient Re-evaluated prior to induction Oxygen Delivery Method: Simple face mask

## 2022-09-15 DIAGNOSIS — F840 Autistic disorder: Secondary | ICD-10-CM | POA: Diagnosis not present

## 2022-09-15 DIAGNOSIS — R3981 Functional urinary incontinence: Secondary | ICD-10-CM | POA: Diagnosis not present

## 2022-09-15 LAB — SURGICAL PATHOLOGY

## 2022-09-16 NOTE — Progress Notes (Signed)
Path is benign - epidermal inclusion cyst or sebaceous cyst.  tmg  Armandina Gemma, MD [facility] A [place] practice Office: [phone]

## 2022-09-18 ENCOUNTER — Encounter (HOSPITAL_BASED_OUTPATIENT_CLINIC_OR_DEPARTMENT_OTHER): Payer: Self-pay | Admitting: Surgery

## 2023-01-11 DIAGNOSIS — F840 Autistic disorder: Secondary | ICD-10-CM | POA: Diagnosis not present

## 2023-01-11 DIAGNOSIS — R3981 Functional urinary incontinence: Secondary | ICD-10-CM | POA: Diagnosis not present

## 2023-02-26 DIAGNOSIS — R569 Unspecified convulsions: Secondary | ICD-10-CM | POA: Diagnosis not present

## 2023-03-16 DIAGNOSIS — R3981 Functional urinary incontinence: Secondary | ICD-10-CM | POA: Diagnosis not present

## 2023-03-16 DIAGNOSIS — F840 Autistic disorder: Secondary | ICD-10-CM | POA: Diagnosis not present

## 2023-05-18 DIAGNOSIS — F840 Autistic disorder: Secondary | ICD-10-CM | POA: Diagnosis not present

## 2023-05-18 DIAGNOSIS — R3981 Functional urinary incontinence: Secondary | ICD-10-CM | POA: Diagnosis not present

## 2023-07-19 DIAGNOSIS — F840 Autistic disorder: Secondary | ICD-10-CM | POA: Diagnosis not present

## 2023-07-19 DIAGNOSIS — R3981 Functional urinary incontinence: Secondary | ICD-10-CM | POA: Diagnosis not present

## 2023-09-06 ENCOUNTER — Emergency Department (HOSPITAL_COMMUNITY): Payer: BC Managed Care – PPO

## 2023-09-06 ENCOUNTER — Inpatient Hospital Stay (HOSPITAL_COMMUNITY): Payer: BC Managed Care – PPO

## 2023-09-06 ENCOUNTER — Inpatient Hospital Stay (HOSPITAL_COMMUNITY)
Admission: EM | Admit: 2023-09-06 | Discharge: 2023-09-09 | DRG: 083 | Disposition: A | Discharge status: Home / Self Care | Payer: BC Managed Care – PPO | Attending: Internal Medicine | Admitting: Internal Medicine

## 2023-09-06 DIAGNOSIS — R402212 Coma scale, best verbal response, none, at arrival to emergency department: Secondary | ICD-10-CM | POA: Diagnosis not present

## 2023-09-06 DIAGNOSIS — G40802 Other epilepsy, not intractable, without status epilepticus: Secondary | ICD-10-CM | POA: Diagnosis not present

## 2023-09-06 DIAGNOSIS — W050XXA Fall from non-moving wheelchair, initial encounter: Secondary | ICD-10-CM | POA: Diagnosis present

## 2023-09-06 DIAGNOSIS — Z79899 Other long term (current) drug therapy: Secondary | ICD-10-CM

## 2023-09-06 DIAGNOSIS — S062XAA Diffuse traumatic brain injury with loss of consciousness status unknown, initial encounter: Secondary | ICD-10-CM | POA: Diagnosis present

## 2023-09-06 DIAGNOSIS — S0990XA Unspecified injury of head, initial encounter: Secondary | ICD-10-CM | POA: Diagnosis not present

## 2023-09-06 DIAGNOSIS — Z888 Allergy status to other drugs, medicaments and biological substances status: Secondary | ICD-10-CM | POA: Diagnosis not present

## 2023-09-06 DIAGNOSIS — I619 Nontraumatic intracerebral hemorrhage, unspecified: Secondary | ICD-10-CM | POA: Diagnosis present

## 2023-09-06 DIAGNOSIS — S064XAA Epidural hemorrhage with loss of consciousness status unknown, initial encounter: Secondary | ICD-10-CM | POA: Diagnosis not present

## 2023-09-06 DIAGNOSIS — I629 Nontraumatic intracranial hemorrhage, unspecified: Secondary | ICD-10-CM | POA: Diagnosis not present

## 2023-09-06 DIAGNOSIS — E872 Acidosis, unspecified: Secondary | ICD-10-CM | POA: Diagnosis not present

## 2023-09-06 DIAGNOSIS — G936 Cerebral edema: Secondary | ICD-10-CM | POA: Diagnosis not present

## 2023-09-06 DIAGNOSIS — R402362 Coma scale, best motor response, obeys commands, at arrival to emergency department: Secondary | ICD-10-CM | POA: Diagnosis not present

## 2023-09-06 DIAGNOSIS — I609 Nontraumatic subarachnoid hemorrhage, unspecified: Secondary | ICD-10-CM | POA: Diagnosis not present

## 2023-09-06 DIAGNOSIS — S0003XA Contusion of scalp, initial encounter: Secondary | ICD-10-CM | POA: Diagnosis not present

## 2023-09-06 DIAGNOSIS — F6381 Intermittent explosive disorder: Secondary | ICD-10-CM | POA: Diagnosis not present

## 2023-09-06 DIAGNOSIS — F840 Autistic disorder: Secondary | ICD-10-CM | POA: Diagnosis present

## 2023-09-06 DIAGNOSIS — S065X0A Traumatic subdural hemorrhage without loss of consciousness, initial encounter: Secondary | ICD-10-CM | POA: Diagnosis not present

## 2023-09-06 DIAGNOSIS — S066X0A Traumatic subarachnoid hemorrhage without loss of consciousness, initial encounter: Secondary | ICD-10-CM

## 2023-09-06 DIAGNOSIS — R402142 Coma scale, eyes open, spontaneous, at arrival to emergency department: Secondary | ICD-10-CM | POA: Diagnosis present

## 2023-09-06 DIAGNOSIS — R000 Tachycardia, unspecified: Secondary | ICD-10-CM | POA: Diagnosis not present

## 2023-09-06 DIAGNOSIS — R569 Unspecified convulsions: Secondary | ICD-10-CM | POA: Diagnosis not present

## 2023-09-06 DIAGNOSIS — E876 Hypokalemia: Secondary | ICD-10-CM | POA: Diagnosis present

## 2023-09-06 DIAGNOSIS — D72829 Elevated white blood cell count, unspecified: Secondary | ICD-10-CM | POA: Diagnosis present

## 2023-09-06 DIAGNOSIS — Y92512 Supermarket, store or market as the place of occurrence of the external cause: Secondary | ICD-10-CM

## 2023-09-06 DIAGNOSIS — S065XAA Traumatic subdural hemorrhage with loss of consciousness status unknown, initial encounter: Principal | ICD-10-CM | POA: Diagnosis present

## 2023-09-06 DIAGNOSIS — Z87828 Personal history of other (healed) physical injury and trauma: Secondary | ICD-10-CM | POA: Diagnosis not present

## 2023-09-06 DIAGNOSIS — S0291XA Unspecified fracture of skull, initial encounter for closed fracture: Secondary | ICD-10-CM | POA: Diagnosis not present

## 2023-09-06 DIAGNOSIS — Z03822 Encounter for observation for suspected aspirated (inhaled) foreign body ruled out: Secondary | ICD-10-CM | POA: Diagnosis not present

## 2023-09-06 DIAGNOSIS — F439 Reaction to severe stress, unspecified: Secondary | ICD-10-CM | POA: Diagnosis not present

## 2023-09-06 DIAGNOSIS — I6200 Nontraumatic subdural hemorrhage, unspecified: Secondary | ICD-10-CM | POA: Diagnosis not present

## 2023-09-06 DIAGNOSIS — S066XAA Traumatic subarachnoid hemorrhage with loss of consciousness status unknown, initial encounter: Secondary | ICD-10-CM | POA: Diagnosis not present

## 2023-09-06 DIAGNOSIS — G40909 Epilepsy, unspecified, not intractable, without status epilepticus: Secondary | ICD-10-CM | POA: Diagnosis not present

## 2023-09-06 DIAGNOSIS — R404 Transient alteration of awareness: Secondary | ICD-10-CM | POA: Diagnosis not present

## 2023-09-06 LAB — BASIC METABOLIC PANEL
Anion gap: 16 — ABNORMAL HIGH (ref 5–15)
BUN: 12 mg/dL (ref 6–20)
CO2: 19 mmol/L — ABNORMAL LOW (ref 22–32)
Calcium: 9.2 mg/dL (ref 8.9–10.3)
Chloride: 102 mmol/L (ref 98–111)
Creatinine, Ser: 0.81 mg/dL (ref 0.61–1.24)
GFR, Estimated: >60 mL/min (ref >60)
Glucose, Bld: 123 mg/dL — ABNORMAL HIGH (ref 70–99)
Potassium: 3.5 mmol/L (ref 3.5–5.1)
Sodium: 137 mmol/L (ref 135–145)

## 2023-09-06 LAB — CBC WITH DIFFERENTIAL/PLATELET
Abs Immature Granulocytes: 0.08 10*3/uL — ABNORMAL HIGH (ref 0.00–0.07)
Basophils Absolute: 0 10*3/uL (ref 0.0–0.1)
Basophils Relative: 0 %
Eosinophils Absolute: 0 10*3/uL (ref 0.0–0.5)
Eosinophils Relative: 0 %
HCT: 44.6 % (ref 39.0–52.0)
Hemoglobin: 15.7 g/dL (ref 13.0–17.0)
Immature Granulocytes: 1 %
Lymphocytes Relative: 7 %
Lymphs Abs: 1.2 10*3/uL (ref 0.7–4.0)
MCH: 33.2 pg (ref 26.0–34.0)
MCHC: 35.2 g/dL (ref 30.0–36.0)
MCV: 94.3 fL (ref 80.0–100.0)
Monocytes Absolute: 1 10*3/uL (ref 0.1–1.0)
Monocytes Relative: 6 %
Neutro Abs: 13.7 10*3/uL — ABNORMAL HIGH (ref 1.7–7.7)
Neutrophils Relative %: 86 %
Platelets: 169 10*3/uL (ref 150–400)
RBC: 4.73 MIL/uL (ref 4.22–5.81)
RDW: 11.4 % — ABNORMAL LOW (ref 11.5–15.5)
WBC: 16 10*3/uL — ABNORMAL HIGH (ref 4.0–10.5)
nRBC: 0 % (ref 0.0–0.2)

## 2023-09-06 LAB — GLUCOSE, CAPILLARY: Glucose-Capillary: 121 mg/dL — ABNORMAL HIGH (ref 70–99)

## 2023-09-06 LAB — HEPATIC FUNCTION PANEL
ALT: 19 U/L (ref 0–44)
AST: 36 U/L (ref 15–41)
Albumin: 3.9 g/dL (ref 3.5–5.0)
Alkaline Phosphatase: 75 U/L (ref 38–126)
Bilirubin, Direct: 0.2 mg/dL (ref 0.0–0.2)
Indirect Bilirubin: 0.5 mg/dL (ref 0.3–0.9)
Total Bilirubin: 0.7 mg/dL (ref 0.0–1.2)
Total Protein: 7.1 g/dL (ref 6.5–8.1)

## 2023-09-06 LAB — PROTIME-INR
INR: 1.2 (ref 0.8–1.2)
Prothrombin Time: 15.6 s — ABNORMAL HIGH (ref 11.4–15.2)

## 2023-09-06 LAB — MAGNESIUM: Magnesium: 1.8 mg/dL (ref 1.7–2.4)

## 2023-09-06 LAB — CK: Total CK: 138 U/L (ref 49–397)

## 2023-09-06 MED ORDER — SODIUM CHLORIDE 0.9 % IV SOLN
300.0000 mg | INTRAVENOUS | Status: DC
  Filled 2023-09-06: qty 30

## 2023-09-06 MED ORDER — ONDANSETRON HCL 4 MG/2ML IJ SOLN
4.0000 mg | Freq: Four times a day (QID) | INTRAMUSCULAR | Status: DC | PRN
  Administered 2023-09-07 – 2023-09-09 (×3): 4 mg via INTRAVENOUS
  Filled 2023-09-06 (×2): qty 2

## 2023-09-06 MED ORDER — SODIUM CHLORIDE 0.9 % IV SOLN
250.0000 mg | INTRAVENOUS | Status: DC
  Administered 2023-09-07: 250 mg via INTRAVENOUS
  Filled 2023-09-06: qty 25

## 2023-09-06 MED ORDER — LORAZEPAM 2 MG/ML IJ SOLN
INTRAMUSCULAR | Status: AC
  Filled 2023-09-06: qty 1

## 2023-09-06 MED ORDER — ORAL CARE MOUTH RINSE: 15.0000 mL | OROMUCOSAL | Status: DC | PRN

## 2023-09-06 MED ORDER — SODIUM CHLORIDE 0.9 % IV SOLN
150.0000 mg | INTRAVENOUS | Status: AC
  Administered 2023-09-06: 150 mg via INTRAVENOUS
  Filled 2023-09-06: qty 15

## 2023-09-06 MED ORDER — HYDRALAZINE HCL 20 MG/ML IJ SOLN: 10.0000 mg | INTRAMUSCULAR | Status: DC | PRN

## 2023-09-06 MED ORDER — SODIUM CHLORIDE 0.9 % IV SOLN
150.0000 mg | Freq: Once | INTRAVENOUS | Status: AC
  Administered 2023-09-07: 150 mg via INTRAVENOUS
  Filled 2023-09-06: qty 15

## 2023-09-06 MED ORDER — ONDANSETRON HCL 4 MG/2ML IJ SOLN
4.0000 mg | Freq: Once | INTRAMUSCULAR | Status: AC
  Administered 2023-09-06: 4 mg via INTRAVENOUS
  Filled 2023-09-06: qty 2

## 2023-09-06 MED ORDER — CHLORHEXIDINE GLUCONATE CLOTH 2 % EX PADS
6.0000 | MEDICATED_PAD | Freq: Every day | CUTANEOUS | Status: DC
  Administered 2023-09-07 – 2023-09-08 (×2): 6 via TOPICAL

## 2023-09-06 MED ORDER — DOCUSATE SODIUM 100 MG PO CAPS: 100.0000 mg | ORAL_CAPSULE | Freq: Two times a day (BID) | ORAL | Status: DC | PRN

## 2023-09-06 MED ORDER — KETOROLAC TROMETHAMINE 30 MG/ML IJ SOLN
30.0000 mg | Freq: Once | INTRAMUSCULAR | Status: AC | PRN
  Administered 2023-09-06: 30 mg via INTRAVENOUS
  Filled 2023-09-06: qty 1

## 2023-09-06 MED ORDER — FAMOTIDINE IN NACL 20-0.9 MG/50ML-% IV SOLN
20.0000 mg | Freq: Two times a day (BID) | INTRAVENOUS | Status: DC
  Administered 2023-09-07 (×2): 20 mg via INTRAVENOUS
  Filled 2023-09-06 (×2): qty 50

## 2023-09-06 MED ORDER — FAMOTIDINE 20 MG PO TABS: 20.0000 mg | ORAL_TABLET | Freq: Two times a day (BID) | ORAL | Status: DC

## 2023-09-06 MED ORDER — CEFTRIAXONE SODIUM 1 G IJ SOLR
1.0000 g | Freq: Once | INTRAMUSCULAR | Status: AC
  Administered 2023-09-06: 1 g via INTRAVENOUS
  Filled 2023-09-06: qty 10

## 2023-09-06 MED ORDER — DOXYCYCLINE HYCLATE 100 MG IV SOLR
100.0000 mg | Freq: Once | INTRAVENOUS | Status: AC
  Administered 2023-09-06: 100 mg via INTRAVENOUS
  Filled 2023-09-06: qty 100

## 2023-09-06 MED ORDER — LORAZEPAM 2 MG/ML IJ SOLN
1.0000 mg | Freq: Once | INTRAMUSCULAR | Status: AC
  Administered 2023-09-06: 1 mg via INTRAVENOUS

## 2023-09-06 MED ORDER — POLYETHYLENE GLYCOL 3350 17 G PO PACK: 17.0000 g | PACK | Freq: Every day | ORAL | Status: DC | PRN

## 2023-09-06 NOTE — ED Notes (Signed)
 Report called in to ICU, RN to transport bedside.

## 2023-09-06 NOTE — ED Notes (Signed)
 ED TO INPATIENT HANDOFF REPORT  ED Nurse Name and Phone #: Lorenza [phone]  S Name/Age/Gender Roy Price 28 y.o. male Room/Bed: 035C/035C  Code Status   Code Status: Full Code  Home/SNF/Other Home Patient oriented to: nonverbal Is this baseline? Yes   Triage Complete: Triage complete  Chief Complaint Bleeding in brain [facility]) [I61.9]  Triage Note Pt BIB [facility] for seizures. Pt had a mechanical fall from wheelchair, abrasion to posterior head. C-collar in place. Patient is known to have seizures once a week. Non-verbal,  legal guardians accompanying.    Allergies Allergies  Allergen Reactions   Dairy Aid [Tilactase] Rash    Opioid effect     Level of Care/Admitting Diagnosis ED Disposition     ED Disposition  Admit   Condition  --   Comment  Hospital Area: [facility] [[id number]]  Level of Care: ICU [6]  May admit patient to [facility] or [facility] if equivalent level of care is available:: Yes  Covid Evaluation: Asymptomatic - no recent exposure (last 10 days) testing not required  Diagnosis: Bleeding in brain [facility]) [[id number]]  Admitting Physician: KARA DORN NOVAK [[id number]]  Attending Physician: DEWALD, JONATHAN B [[id number]]  Certification:: I certify this patient will need inpatient services for at least 2 midnights  Expected Medical Readiness: 09/09/2023          B Medical/Surgery History Past Medical History:  Diagnosis Date   Autism    Intermittent explosive disorder    Seizures (HCC)    Past Surgical History:  Procedure Laterality Date   arm fracture     due to seizures   brain surgery     broken leg     CRANIOTOMY FOR SUBDURAL IMPLANTATION OF ELECTRODE ARRAY     MASS EXCISION Right 09/14/2022   Procedure: EXCISION MASS RIGHT SHOULDER;  Surgeon: Eletha Boas, MD;  Location: [facility];  Service: General;  Laterality: Right;   OTHER SURGICAL HISTORY     Bilateral Frontal Sub- Pial Resections     A IV  Location/Drains/Wounds Patient Lines/Drains/Airways Status     Active Line/Drains/Airways     Name Placement date Placement time Site Days   Peripheral IV 09/06/23 20 G Left Antecubital 09/06/23  1502  Antecubital  less than 1            Intake/Output Last 24 hours No intake or output data in the 24 hours ending 09/06/23 1939  Labs/Imaging Results for orders placed or performed during the hospital encounter of 09/06/23 (from the past 48 hours)  Basic metabolic panel     Status: Abnormal   Collection Time: 09/06/23  2:47 PM  Result Value Ref Range   Sodium 137 135 - 145 mmol/L   Potassium 3.5 3.5 - 5.1 mmol/L   Chloride 102 98 - 111 mmol/L   CO2 19 (L) 22 - 32 mmol/L   Glucose, Bld 123 (H) 70 - 99 mg/dL    Comment: Glucose reference range applies only to samples taken after fasting for at least 8 hours.   BUN 12 6 - 20 mg/dL   Creatinine, Ser 9.18 0.61 - 1.24 mg/dL   Calcium 9.2 8.9 - 89.6 mg/dL   GFR, Estimated >39 >39 mL/min    Comment: (NOTE) Calculated using the CKD-EPI Creatinine Equation (2021)    Anion gap 16 (H) 5 - 15    Comment: Performed at [facility], [address] [place], [place] [address]  CBC with Differential  Status: Abnormal   Collection Time: 09/06/23  2:47 PM  Result Value Ref Range   WBC 16.0 (H) 4.0 - 10.5 K/uL   RBC 4.73 4.22 - 5.81 MIL/uL   Hemoglobin 15.7 13.0 - 17.0 g/dL   HCT 55.3 60.9 - 47.9 %   MCV 94.3 80.0 - 100.0 fL   MCH 33.2 26.0 - 34.0 pg   MCHC 35.2 30.0 - 36.0 g/dL   RDW 88.5 (L) 88.4 - 84.4 %   Platelets 169 150 - 400 K/uL   nRBC 0.0 0.0 - 0.2 %   Neutrophils Relative % 86 %   Neutro Abs 13.7 (H) 1.7 - 7.7 K/uL   Lymphocytes Relative 7 %   Lymphs Abs 1.2 0.7 - 4.0 K/uL   Monocytes Relative 6 %   Monocytes Absolute 1.0 0.1 - 1.0 K/uL   Eosinophils Relative 0 %   Eosinophils Absolute 0.0 0.0 - 0.5 K/uL   Basophils Relative 0 %   Basophils Absolute 0.0 0.0 - 0.1 K/uL   Immature Granulocytes 1 %   Abs Immature  Granulocytes 0.08 (H) 0.00 - 0.07 K/uL    Comment: Performed at [facility], [address], [place], [place] [address]  Magnesium      Status: None   Collection Time: 09/06/23  2:47 PM  Result Value Ref Range   Magnesium  1.8 1.7 - 2.4 mg/dL    Comment: Performed at [facility], [address] [place], [place] [address]   CT Head Wo Contrast Result Date: 09/06/2023 CLINICAL DATA:  Head trauma, moderate-severe; Polytrauma, blunt. EXAM: CT HEAD WITHOUT CONTRAST CT CERVICAL SPINE WITHOUT CONTRAST TECHNIQUE: Multidetector CT imaging of the head and cervical spine was performed following the standard protocol without intravenous contrast. Multiplanar CT image reconstructions of the cervical spine were also generated. RADIATION DOSE REDUCTION: This exam was performed according to the departmental dose-optimization program which includes automated exposure control, adjustment of the mA and/or kV according to patient size and/or use of iterative reconstruction technique. COMPARISON:  CT head 07/25/2017.  CT cervical spine 07/20/2016. FINDINGS: CT HEAD FINDINGS Brain: There are acute hemorrhagic contusions anteriorly in the frontal lobes, left larger than right with associated mild edema. Small volume acute subarachnoid hemorrhage is most notable within left frontal sulci. An acute subdural hematoma over the anterior right frontal convexity measures up to 12 mm in thickness. Acute subdural hematoma along the left aspect of the falx measures up to 8 mm in thickness. There is also an acute subdural hematoma over the left cerebral convexity, including in the anterior frontal region where measures up to 9 mm in thickness. Acute extra-axial hemorrhage over the left parietal convexity has a biconvex configuration and measures up to 15 mm in thickness and could be epidural or subdural. A small area of cortical/subcortical hypodensity laterally in the left frontoparietal region corresponds to encephalomalacia  on the prior CT. There is also pre-existing mild encephalomalacia in the anterior frontal lobes based on the prior CT. The ventricles are unchanged in size without evidence of hydrocephalus. There is no midline shift. The basilar cisterns are patent. Vascular: No hyperdense vessel. Skull: Prior bilateral craniotomies. A nondisplaced left frontoparietal skull fracture is similar to sagittal reformats of the prior head CT and is therefore presumed chronic, although there is a moderate-sized scalp left-sided scalp hematoma. Sinuses/Orbits: Paranasal sinuses and mastoid air cells are clear. Unremarkable orbits. Other: None. CT CERVICAL SPINE FINDINGS Alignment: Normal. Skull base and vertebrae: No acute fracture or suspicious osseous  lesion. Soft tissues and spinal canal: No prevertebral fluid or swelling. No visible canal hematoma. Disc levels:  Minimal cervical spondylosis. Upper chest: Clear lung apices. Other: None. Critical Value/emergent results were called by telephone at the time of interpretation on 09/06/2023 at 6:01 pm to Dr. Donnice Hughes, who verbally acknowledged these results. IMPRESSION: 1. Acute bifrontal hemorrhagic contusions. 2. Acute bilateral subdural hematomas. Subdural versus epidural hematoma over the left parietal convexity. No midline shift. 3. Small volume acute subarachnoid hemorrhage. 4. No acute cervical spine fracture. Electronically Signed   By: Dasie Hamburg M.D.   On: 09/06/2023 18:31   CT Cervical Spine Wo Contrast Result Date: 09/06/2023 CLINICAL DATA:  Head trauma, moderate-severe; Polytrauma, blunt. EXAM: CT HEAD WITHOUT CONTRAST CT CERVICAL SPINE WITHOUT CONTRAST TECHNIQUE: Multidetector CT imaging of the head and cervical spine was performed following the standard protocol without intravenous contrast. Multiplanar CT image reconstructions of the cervical spine were also generated. RADIATION DOSE REDUCTION: This exam was performed according to the departmental dose-optimization  program which includes automated exposure control, adjustment of the mA and/or kV according to patient size and/or use of iterative reconstruction technique. COMPARISON:  CT head 07/25/2017.  CT cervical spine 07/20/2016. FINDINGS: CT HEAD FINDINGS Brain: There are acute hemorrhagic contusions anteriorly in the frontal lobes, left larger than right with associated mild edema. Small volume acute subarachnoid hemorrhage is most notable within left frontal sulci. An acute subdural hematoma over the anterior right frontal convexity measures up to 12 mm in thickness. Acute subdural hematoma along the left aspect of the falx measures up to 8 mm in thickness. There is also an acute subdural hematoma over the left cerebral convexity, including in the anterior frontal region where measures up to 9 mm in thickness. Acute extra-axial hemorrhage over the left parietal convexity has a biconvex configuration and measures up to 15 mm in thickness and could be epidural or subdural. A small area of cortical/subcortical hypodensity laterally in the left frontoparietal region corresponds to encephalomalacia on the prior CT. There is also pre-existing mild encephalomalacia in the anterior frontal lobes based on the prior CT. The ventricles are unchanged in size without evidence of hydrocephalus. There is no midline shift. The basilar cisterns are patent. Vascular: No hyperdense vessel. Skull: Prior bilateral craniotomies. A nondisplaced left frontoparietal skull fracture is similar to sagittal reformats of the prior head CT and is therefore presumed chronic, although there is a moderate-sized scalp left-sided scalp hematoma. Sinuses/Orbits: Paranasal sinuses and mastoid air cells are clear. Unremarkable orbits. Other: None. CT CERVICAL SPINE FINDINGS Alignment: Normal. Skull base and vertebrae: No acute fracture or suspicious osseous lesion. Soft tissues and spinal canal: No prevertebral fluid or swelling. No visible canal hematoma.  Disc levels:  Minimal cervical spondylosis. Upper chest: Clear lung apices. Other: None. Critical Value/emergent results were called by telephone at the time of interpretation on 09/06/2023 at 6:01 pm to Dr. Donnice Hughes, who verbally acknowledged these results. IMPRESSION: 1. Acute bifrontal hemorrhagic contusions. 2. Acute bilateral subdural hematomas. Subdural versus epidural hematoma over the left parietal convexity. No midline shift. 3. Small volume acute subarachnoid hemorrhage. 4. No acute cervical spine fracture. Electronically Signed   By: Dasie Hamburg M.D.   On: 09/06/2023 18:31   DG Chest Portable 1 View Result Date: 09/06/2023 CLINICAL DATA:  Recent fall, assess for aspiration EXAM: PORTABLE CHEST 1 VIEW COMPARISON:  Chest x-ray August 31, 2022 FINDINGS: The cardiomediastinal silhouette is unchanged in contour. No focal pulmonary opacity. No pleural effusion or pneumothorax.  The visualized upper abdomen is unremarkable. No acute osseous abnormality. IMPRESSION: No acute cardiopulmonary abnormality. Electronically Signed   By: Dirk Arrant M.D.   On: 09/06/2023 17:06    Pending Labs Unresulted Labs (From admission, onward)     Start     Ordered   09/07/23 0500  CBC  Tomorrow morning,   R        09/06/23 1932   09/07/23 0500  Basic metabolic panel  Tomorrow morning,   R        09/06/23 1932   09/07/23 0500  Magnesium   Tomorrow morning,   R        09/06/23 1932   09/07/23 0500  Phosphorus  Tomorrow morning,   R        09/06/23 1932   09/06/23 1932  Type and screen If need to transfuse blood products please use the blood administration order set  Once,   R       Comments: If need to transfuse blood products please use the blood administration order set    09/06/23 1932   09/06/23 1932  Urinalysis, Routine w reflex microscopic -Urine, Clean Catch  Once,   R       Question:  Specimen Source  Answer:  Urine, Clean Catch   09/06/23 1932   09/06/23 1923  Protime-INR  Once,   STAT         09/06/23 1922   09/06/23 1912  Hepatic function panel  Add-on,   AD        09/06/23 1911            Vitals/Pain Today's Vitals   09/06/23 1745 09/06/23 1800 09/06/23 1815 09/06/23 1938  BP:  138/83 135/80   Pulse: 100 89 89   Resp: 20 17 17    Temp:    99.1 F (37.3 C)  TempSrc:    Axillary  SpO2: 99% 98% 95%     Isolation Precautions No active isolations  Medications Medications  doxycycline  (VIBRAMYCIN ) 100 mg in dextrose  5 % 250 mL IVPB (100 mg Intravenous New Bag/Given 09/06/23 1829)  lacosamide  (VIMPAT ) 150 mg in sodium chloride  0.9 % 25 mL IVPB (has no administration in time range)  docusate sodium  (COLACE) capsule 100 mg (has no administration in time range)  polyethylene glycol (MIRALAX  / GLYCOLAX ) packet 17 g (has no administration in time range)  ondansetron  (ZOFRAN ) injection 4 mg (has no administration in time range)  ketorolac  (TORADOL ) 30 MG/ML injection 30 mg (30 mg Intravenous Given 09/06/23 1531)  ondansetron  (ZOFRAN ) injection 4 mg (4 mg Intravenous Given 09/06/23 1554)  cefTRIAXone  (ROCEPHIN ) 1 g in sodium chloride  0.9 % 100 mL IVPB (0 g Intravenous Stopped 09/06/23 1830)  LORazepam  (ATIVAN ) injection 1 mg ( Intravenous Not Given 09/06/23 1933)    Mobility Wheelchair bound     Focused Assessments Cardiac Assessment Handoff:    No results found for: CKTOTAL, CKMB, CKMBINDEX, TROPONINI No results found for: DDIMER Does the Patient currently have chest pain? No   , Neuro Assessment Handoff:  Swallow screen pass? No          Neuro Assessment: Exceptions to WDL Neuro Checks:      Has TPA been given? No If patient is a Neuro Trauma and patient is going to OR before floor call report to 4N Charge nurse: [phone] or [phone]   R Recommendations: See Admitting Provider Note  Report given to:   Additional Notes:

## 2023-09-06 NOTE — ED Notes (Signed)
 Pt has hematoma to back of head.  Pt is currently in C-collar from EMS.

## 2023-09-06 NOTE — Progress Notes (Signed)
 Per RN, pt going to ct now, EEG to be done after as schedule allows

## 2023-09-06 NOTE — Progress Notes (Signed)
 Repeat CT head without contrast reviewed. Expected evolution of contusions, no significant increase in extraaxial hemorrhage, no midline shift. Plan for EEG, continued supportive care.  Will also need close metabolic monitoring as he is at risk of SIADH.

## 2023-09-06 NOTE — ED Triage Notes (Signed)
 Pt BIB [facility] for [facility]. Pt had a mechanical fall from wheelchair, abrasion to posterior head. C-collar in place. Patient is known to have seizures once a week. Non-verbal,  legal guardians accompanying.

## 2023-09-06 NOTE — ED Notes (Signed)
 I wasted 1ml of ativan with Raymar RN.

## 2023-09-06 NOTE — Progress Notes (Signed)
 Pt just moved, ICU team getting him settled in. EEG to be conducted when pt is ready as schedule allows.

## 2023-09-06 NOTE — Progress Notes (Signed)
 eLink Physician-Brief Progress Note Patient Name: Roy Price DOB: 11/13/1995 MRN: [id number]   Date of Service  09/06/2023  HPI/Events of Note  28 year old male with a history of autism who is nonverbal at baseline and seizure disorder presented after unwitnessed and seizures found to have an acute hemorrhagic contusion in the frontal lobes admitted to ICU for further management.  Vital signs are within normal limits.  Saturating 99% on 3 L.  Metabolic panel revealed metabolic acidosis, leukocytosis.  CT head initial with subdural hematomas, subarachnoid hematoma, and hemorrhagic contusions.    eICU Interventions  Tight blood pressure control, aiming for SBP less than 140.  Maintain antiepileptic therapy, EEG ordered   DVT prophylaxis with SCDs, avoid chemoprophylaxis in the setting of intracranial hemorrhage Famotidine  for GI prophylaxis     Intervention Category Evaluation Type: New Patient Evaluation  Gleb Mcguire 09/06/2023, 10:59 PM

## 2023-09-06 NOTE — Progress Notes (Signed)
 Per epic, pt not back from CT. Eeg to be done when schedule allows

## 2023-09-06 NOTE — ED Provider Notes (Signed)
 [facility] Provider Note   CSN: [id number] Arrival date & time: 09/06/23  1440     History  Chief Complaint  Patient presents with   Seizures    Roy Price is a 28 y.o. male with a history of longstanding seizure disorder presenting to the emergency department accompanied with his parents with concern for breakthrough seizures.  His father was a physician and his mother both at bedside.  They report the patient has had lifelong seizures, approximately once per week.  They are typically relatively well-controlled, with his current regimen of Vimpat  200 mg in the morning 150 mg at night, as well as Xcopri (cenobamate) he was started on recently by his neurologist.  The patient has advanced autism, is minimally to nonverbal, and tends to wear noise canceling headphones to remain calm.  Today he was out at [employer] with his caretaker and his mother reports that she thinks he was overstimulated, and then he had 2 back-to-back seizures, striking his head on the ground.  She says having 2 seizures is quite atypical for him, typically it is just 1 seizure.  He is frequently quite fatigued and sleeps for a long time after his seizures, this is not unusual according to the parents.  They are concerned because is that he has had a brain contusion from striking his head in the past.  The patient himself is nonverbal cannot provide any history, is a level 5 caveat  HPI     Home Medications Prior to Admission medications   Medication Sig Start Date End Date Taking? Authorizing Provider  cholecalciferol (VITAMIN D ) 400 UNITS TABS Take 2,000 Units by mouth at bedtime.    Yes [provider]  co-enzyme Q-10 30 MG capsule Take 30 mg by mouth at bedtime.    Yes [provider]  Lacosamide  (VIMPAT ) 100 MG TABS Take 150-200 mg by mouth See admin instructions. Take 200 mg by mouth in the morning and then take 150 mg in the evening   Yes  [provider]  vitamin C  (ASCORBIC ACID) 500 MG tablet Take 500 mg by mouth daily.   Yes [provider]  vitamin E  400 UNIT capsule Take 400 Units by mouth daily.   Yes [provider]  XCOPRI 100 MG TABS Take 1 tablet by mouth at bedtime.   Yes [provider]      Allergies    Dairy aid [tilactase]    Review of Systems   Review of Systems  Physical Exam Updated Vital Signs BP 127/66   Pulse (!) 105   Temp 99.1 F (37.3 C) (Axillary)   Resp 19   SpO2 95%  Physical Exam Constitutional:      General: He is not in acute distress.    Comments: Lying on bed with sunglasses on, noise canceling headphones to remain calm, in no visible distress  HENT:     Head: Normocephalic and atraumatic.  Eyes:     Conjunctiva/sclera: Conjunctivae normal.     Pupils: Pupils are equal, round, and reactive to light.  Neck:     Comments: C spine collar in place Cardiovascular:     Rate and Rhythm: Normal rate and regular rhythm.  Pulmonary:     Effort: Pulmonary effort is normal. No respiratory distress.  Abdominal:     General: There is no distension.     Tenderness: There is no abdominal tenderness.  Skin:    General: Skin is  warm and dry.  Neurological:     General: No focal deficit present.     Mental Status: He is alert. Mental status is at baseline.     ED Results / Procedures / Treatments   Labs (all labs ordered are listed, but only abnormal results are displayed) Labs Reviewed  BASIC METABOLIC PANEL - Abnormal; Notable for the following components:      Result Value   CO2 19 (*)    Glucose, Bld 123 (*)    Anion gap 16 (*)    All other components within normal limits  CBC WITH DIFFERENTIAL/PLATELET - Abnormal; Notable for the following components:   WBC 16.0 (*)    RDW 11.4 (*)    Neutro Abs 13.7 (*)    Abs Immature Granulocytes 0.08 (*)    All other components within normal limits  MAGNESIUM   HEPATIC FUNCTION PANEL  PROTIME-INR   URINALYSIS, ROUTINE W REFLEX MICROSCOPIC  CBC  BASIC METABOLIC PANEL  MAGNESIUM   PHOSPHORUS  TYPE AND SCREEN    EKG None  Radiology CT Head Wo Contrast Result Date: 09/06/2023 CLINICAL DATA:  Head trauma, moderate-severe; Polytrauma, blunt. EXAM: CT HEAD WITHOUT CONTRAST CT CERVICAL SPINE WITHOUT CONTRAST TECHNIQUE: Multidetector CT imaging of the head and cervical spine was performed following the standard protocol without intravenous contrast. Multiplanar CT image reconstructions of the cervical spine were also generated. RADIATION DOSE REDUCTION: This exam was performed according to the departmental dose-optimization program which includes automated exposure control, adjustment of the mA and/or kV according to patient size and/or use of iterative reconstruction technique. COMPARISON:  CT head 07/25/2017.  CT cervical spine 07/20/2016. FINDINGS: CT HEAD FINDINGS Brain: There are acute hemorrhagic contusions anteriorly in the frontal lobes, left larger than right with associated mild edema. Small volume acute subarachnoid hemorrhage is most notable within left frontal sulci. An acute subdural hematoma over the anterior right frontal convexity measures up to 12 mm in thickness. Acute subdural hematoma along the left aspect of the falx measures up to 8 mm in thickness. There is also an acute subdural hematoma over the left cerebral convexity, including in the anterior frontal region where measures up to 9 mm in thickness. Acute extra-axial hemorrhage over the left parietal convexity has a biconvex configuration and measures up to 15 mm in thickness and could be epidural or subdural. A small area of cortical/subcortical hypodensity laterally in the left frontoparietal region corresponds to encephalomalacia on the prior CT. There is also pre-existing mild encephalomalacia in the anterior frontal lobes based on the prior CT. The ventricles are unchanged in size without evidence of hydrocephalus. There is  no midline shift. The basilar cisterns are patent. Vascular: No hyperdense vessel. Skull: Prior bilateral craniotomies. A nondisplaced left frontoparietal skull fracture is similar to sagittal reformats of the prior head CT and is therefore presumed chronic, although there is a moderate-sized scalp left-sided scalp hematoma. Sinuses/Orbits: Paranasal sinuses and mastoid air cells are clear. Unremarkable orbits. Other: None. CT CERVICAL SPINE FINDINGS Alignment: Normal. Skull base and vertebrae: No acute fracture or suspicious osseous lesion. Soft tissues and spinal canal: No prevertebral fluid or swelling. No visible canal hematoma. Disc levels:  Minimal cervical spondylosis. Upper chest: Clear lung apices. Other: None. Critical Value/emergent results were called by telephone at the time of interpretation on 09/06/2023 at 6:01 pm to Dr. Donnice Hughes, who verbally acknowledged these results. IMPRESSION: 1. Acute bifrontal hemorrhagic contusions. 2. Acute bilateral subdural hematomas. Subdural versus epidural hematoma over the left parietal convexity. No  midline shift. 3. Small volume acute subarachnoid hemorrhage. 4. No acute cervical spine fracture. Electronically Signed   By: Dasie Hamburg M.D.   On: 09/06/2023 18:31   CT Cervical Spine Wo Contrast Result Date: 09/06/2023 CLINICAL DATA:  Head trauma, moderate-severe; Polytrauma, blunt. EXAM: CT HEAD WITHOUT CONTRAST CT CERVICAL SPINE WITHOUT CONTRAST TECHNIQUE: Multidetector CT imaging of the head and cervical spine was performed following the standard protocol without intravenous contrast. Multiplanar CT image reconstructions of the cervical spine were also generated. RADIATION DOSE REDUCTION: This exam was performed according to the departmental dose-optimization program which includes automated exposure control, adjustment of the mA and/or kV according to patient size and/or use of iterative reconstruction technique. COMPARISON:  CT head 07/25/2017.  CT  cervical spine 07/20/2016. FINDINGS: CT HEAD FINDINGS Brain: There are acute hemorrhagic contusions anteriorly in the frontal lobes, left larger than right with associated mild edema. Small volume acute subarachnoid hemorrhage is most notable within left frontal sulci. An acute subdural hematoma over the anterior right frontal convexity measures up to 12 mm in thickness. Acute subdural hematoma along the left aspect of the falx measures up to 8 mm in thickness. There is also an acute subdural hematoma over the left cerebral convexity, including in the anterior frontal region where measures up to 9 mm in thickness. Acute extra-axial hemorrhage over the left parietal convexity has a biconvex configuration and measures up to 15 mm in thickness and could be epidural or subdural. A small area of cortical/subcortical hypodensity laterally in the left frontoparietal region corresponds to encephalomalacia on the prior CT. There is also pre-existing mild encephalomalacia in the anterior frontal lobes based on the prior CT. The ventricles are unchanged in size without evidence of hydrocephalus. There is no midline shift. The basilar cisterns are patent. Vascular: No hyperdense vessel. Skull: Prior bilateral craniotomies. A nondisplaced left frontoparietal skull fracture is similar to sagittal reformats of the prior head CT and is therefore presumed chronic, although there is a moderate-sized scalp left-sided scalp hematoma. Sinuses/Orbits: Paranasal sinuses and mastoid air cells are clear. Unremarkable orbits. Other: None. CT CERVICAL SPINE FINDINGS Alignment: Normal. Skull base and vertebrae: No acute fracture or suspicious osseous lesion. Soft tissues and spinal canal: No prevertebral fluid or swelling. No visible canal hematoma. Disc levels:  Minimal cervical spondylosis. Upper chest: Clear lung apices. Other: None. Critical Value/emergent results were called by telephone at the time of interpretation on 09/06/2023 at 6:01  pm to Dr. Donnice Hughes, who verbally acknowledged these results. IMPRESSION: 1. Acute bifrontal hemorrhagic contusions. 2. Acute bilateral subdural hematomas. Subdural versus epidural hematoma over the left parietal convexity. No midline shift. 3. Small volume acute subarachnoid hemorrhage. 4. No acute cervical spine fracture. Electronically Signed   By: Dasie Hamburg M.D.   On: 09/06/2023 18:31   DG Chest Portable 1 View Result Date: 09/06/2023 CLINICAL DATA:  Recent fall, assess for aspiration EXAM: PORTABLE CHEST 1 VIEW COMPARISON:  Chest x-ray August 31, 2022 FINDINGS: The cardiomediastinal silhouette is unchanged in contour. No focal pulmonary opacity. No pleural effusion or pneumothorax. The visualized upper abdomen is unremarkable. No acute osseous abnormality. IMPRESSION: No acute cardiopulmonary abnormality. Electronically Signed   By: Dirk Arrant M.D.   On: 09/06/2023 17:06    Procedures Procedures    Medications Ordered in ED Medications  lacosamide  (VIMPAT ) 150 mg in sodium chloride  0.9 % 25 mL IVPB (150 mg Intravenous New Bag/Given 09/06/23 2015)  docusate sodium  (COLACE) capsule 100 mg (has no administration in time range)  polyethylene glycol (MIRALAX  / GLYCOLAX ) packet 17 g (has no administration in time range)  ondansetron  (ZOFRAN ) injection 4 mg (has no administration in time range)  hydrALAZINE  (APRESOLINE ) injection 10-40 mg (has no administration in time range)  ketorolac  (TORADOL ) 30 MG/ML injection 30 mg (30 mg Intravenous Given 09/06/23 1531)  ondansetron  (ZOFRAN ) injection 4 mg (4 mg Intravenous Given 09/06/23 1554)  cefTRIAXone  (ROCEPHIN ) 1 g in sodium chloride  0.9 % 100 mL IVPB (0 g Intravenous Stopped 09/06/23 1830)  doxycycline  (VIBRAMYCIN ) 100 mg in dextrose  5 % 250 mL IVPB (100 mg Intravenous New Bag/Given 09/06/23 1829)  LORazepam  (ATIVAN ) injection 1 mg ( Intravenous Not Given 09/06/23 1933)    ED Course/ Medical Decision Making/ A&P Clinical Course as of 09/06/23  2035  Thu Sep 06, 2023  1715 Pt reportedly vomited once, given zofran .  RN contacting CT to request expedited CT imaging. [MT]  1715 I spoke to the parents and agreed to initiate empiric treatment for aspiration pneumonia, given that the patient has been prone to this in the past.Spo2 down to 95% in room which parents state is low for him.  He also has a leukocytosis white blood cell count 16,000, which may be reactive from seizure, but it may also indicate an infection.  The single view chest x-ray did not show an obvious infiltrate but this is a limited view.  I think it is reasonable to treat empirically [MT]  1812 Pt's father updated regarding brain bleed - page placed to neurosurgery - pt resting comfortably in room [MT]  1836 Spoke to NSGY PA, who will confer with attending and return with admission recommendations [MT]  1842 [facility] called back, recommending PCCM admission to neuro ICU for monitoring, with NSGY in consultation.  Paged Pulm Crit [MT]  [phone] Spoke to [facility] from [facility], they will consult and admit medically with neurosurgery in consultation.  At this time we are agreed with the specialist that there is no emergent indication for neurology evaluation, given that there is not evidence of status epilepticus and they patient is relatively well controlled on his baseline medications.  Continue Vimpat  and Xcopri for now.  Holding off on Keppra at the moment as his father reports pt had strong adverse reactions to keppra in the past (emotional lability, mood disorder) [MT]  1923 Pt having seizure episode, 1 mg Ativan  given, IV vimpat  ordered per evening dose [MT]  1930 NSGY and ICU team updated regarding repeat seizure - I queried NSGY team regarding need for repeat imaging, awaiting response [MT]  1937 Dr Debby to see patient this evening, does not feel emergent repeat CT needed at this time.  PCCM updated, and neurology consult now placed. [MT]  2034 Dr Jerrie neurology to see patient.   Pt taken to ICU in stable condition [MT]    Clinical Course User Index [MT] Lequisha Cammack, Donnice PARAS, MD                                 Medical Decision Making Amount and/or Complexity of Data Reviewed Labs: ordered. Radiology: ordered.  Risk Prescription drug management. Decision regarding hospitalization.   Patient is presenting to ED with concern for breakthrough seizures.  He does have seizures with high frequency is a lifelong issue.  But he has had 2 back-to-back seizures today which his parents that was unusual.  They are concerned about a head contusion.  CT imaging of the head and  cervical spine been ordered.  At this point the patient appears to be calm, no evident distress.  Unfortunately he is nearly completely nonverbal, therefore not able to articulate any symptoms to us .  Supplemental history is provided by his parents at the bedside.  I did review his external records including his neurology outpatient notes from 02/26/23 Dr Sharlie noting that the patient typically has predictable seizures about once a week, often when he is laying down in the early morning.  The patient had a CT scan of the head most recently done in July 25, 2017 in our medical system at [facility], noting bifrontal encephalomalacia at sites of previously and hemorrhagic contusion.  I personally ordered, reviewed patient's labs and imaging today, notable for intracranial bleed.  Labs with a leukocytosis.  IV Ativan  given for seizure episode that occurred in the ED.  IV Vimpat  ordered for evening antiepileptic dose.  Zofran  was given for nausea.  A small dose of Toradol  was initially given upon patient's arrival for myalgia, but further NSAIDs will be held given concern for bleeding.  Neurology was consulted, with consult recommendations pending.  Neurosurgery was also consulted recommending observation admission in the hospital, with final consults pending.  The patient was admitted to the critical care  service.          Final Clinical Impression(s) / ED Diagnoses Final diagnoses:  Intracranial bleed [facility])    Rx / DC Orders ED Discharge Orders     None         Cottie Donnice PARAS, MD 09/06/23 2035

## 2023-09-06 NOTE — ED Notes (Signed)
 Patient transported to CT

## 2023-09-06 NOTE — Consult Note (Signed)
 CC: seizure, ICH  HPI:     Patient is a 28 y.o. male with hx of epilepsy, severe autism had a seizure, hitting his head on the ground.  He had vomiting afterwards, arrived to the ED at his neurologic baseline but sleepier.  CT head obtained showed multiple brain contusions, small EDH.  He had another seizure in the ER and was given Ativan .  Currently on Vimpat  gtt.  Per the mother, he missed his dose of anti-epileptics yesterday.  He normally has one GTC per week, lives in a padded room.  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   Intermittent explosive disorder 01/17/2013   Nocturnal enuresis 01/17/2013   Autistic disorder 01/17/2013   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   Leukocytopenia 01/17/2013   Encounter for long-term (current) use of other medications 01/17/2013   Autism 06/17/2012   Seizures (HCC) 06/17/2012   Past Medical History:  Diagnosis Date   Autism    Intermittent explosive disorder    Seizures (HCC)     Past Surgical History:  Procedure Laterality Date   arm fracture     due to seizures   brain surgery     broken leg     CRANIOTOMY FOR SUBDURAL IMPLANTATION OF ELECTRODE ARRAY     MASS EXCISION Right 09/14/2022   Procedure: EXCISION MASS RIGHT SHOULDER;  Surgeon: Eletha Boas, MD;  Location: [facility];  Service: General;  Laterality: Right;   OTHER SURGICAL HISTORY     Bilateral Frontal Sub- Pial Resections    (Not in a hospital  admission)  Allergies  Allergen Reactions   Dairy Aid [Tilactase] Rash    Opioid effect     Social History   Tobacco Use   Smoking status: Never   Smokeless tobacco: Never  Substance Use Topics   Alcohol use: No    Family History  Problem Relation Age of Onset   Migraines Mother        Intractable Migraines   Hypothyroidism Mother    Stroke Maternal Grandfather        Died at 3   Alcohol abuse Neg Hx      Review of Systems Review of systems not obtained due to patient factors.  Objective:   Patient Vitals for the past 8 hrs:  BP Temp Temp src Pulse Resp SpO2  09/06/23 2000 127/66 -- -- (!) 105 19 95 %  09/06/23 1938 -- 99.1 F (37.3 C) Axillary -- -- --  09/06/23 1815 135/80 -- -- 89 17 95 %  09/06/23 1800 138/83 -- -- 89 17 98 %  09/06/23 1745 -- -- -- 100 20 99 %  09/06/23 1730 134/85 -- -- 88 16 98 %  09/06/23 1700 137/83 -- -- 90 16 98 %  09/06/23 1630 138/83 -- -- 90 17 98 %  09/06/23 1615 139/82 -- -- 90 18 98 %  09/06/23 1446 (!) 139/90 -- -- -- -- --  09/06/23 1444 -- 98.4 F (36.9 C)  Oral (!) 115 18 100 %   No intake/output data recorded. No intake/output data recorded.  Eyes closed, dysconjugate gaze, pupils 4 mm reactive Localizes bilaterally, L more briskly than right  Data ReviewCBC:  Lab Results  Component Value Date   WBC 16.0 (H) 09/06/2023   RBC 4.73 09/06/2023   BMP:  Lab Results  Component Value Date   GLUCOSE 123 (H) 09/06/2023   CO2 19 (L) 09/06/2023   BUN 12 09/06/2023   CREATININE 0.81 09/06/2023   CALCIUM 9.2 09/06/2023   Radiology review:   CT head without contrast performed today was reviewed and compared with prior available studies.  There are bifrontal contusions in area of previous encephalomalacia.  There is a left frontal opercular contusion, also in area of previous injury/encephalomalacia.  There is associated tSAH, small parietal EDH/SDH.  No midline shift, cisterns widely patent  Assessment:  This is a 28 yo  M with autism, seizure disorder who had recent seizure with head trauma, multiple brain contusions with associated small extraaxial hematoma  Plan:  - admit to NICU for monitoring - will likely need continuous EEG, neurology consult - keep SBP < 140 - as his exam is currently limited postically and s/p benzo treatment, will repeat another CT head now - no neurosurgical intervention indicated at this time

## 2023-09-06 NOTE — ED Notes (Signed)
 Removed C-Collar per MD Trifan order.

## 2023-09-06 NOTE — ED Notes (Signed)
 Pt had one emesis episode. MD Trifan notified and aware.

## 2023-09-06 NOTE — Progress Notes (Addendum)
 Pt arrived to [facility] ED after a seizure this afternoon where he hit head on ground w/ vomiting to follow. Accompanied by parents. H/o seizure disorder, poorly controlled. H/o autism. No neuro deficits/variations from normal per EDP/family. CTH revealing b/l frontal hemorrhagic contusions/SAH, b/l SDH, falcine SDH. Prior b/l crani.   Recommend admission to NICU for high seizure risk and management, suggest Neurology consult. Repeat CTH in 8H. No surgical intervention indicated at this time.   Roy Schlarb [name] [name], PA-C

## 2023-09-06 NOTE — Consult Note (Signed)
 NEUROLOGY CONSULT NOTE   Date of service: September 06, 2023 Patient Name: Roy Price MRN:  [id number] DOB:  02/14/1996 Chief Complaint: Fall after seizure Requesting Provider: Kara Dorn NOVAK, MD  History of Present Illness  Roy Price is a 28 y.o. male  has a past medical history of Autism, Intermittent explosive disorder, and Seizures (HCC).  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    Today he had a seizure resulting in fall while with another caregiver.  Subsequently had another seizure and was vomiting.  Brought to the ED where he was found to have subdural hemorrhage, bilateral frontal hemorrhagic contusions, subarachnoid hemorrhage.  Per family had a similar fall with head injury in 2018  Regarding his seizure history, he had developmental regression starting about 18 months, first EEG unrevealing at age 60 but diagnosis of seizures based on MEG a little later with multiple treatments including steroids and IVIG trailed per [facility] clinic notes; GTCs and habitual seizures beginning in ~2010  For many years that he has been stably managed on Vimpat  at a high dose of 300 twice daily, with baseline seizure frequency of approximately once a week after which she is typically foggy for 24 hours.  On this high dose of Vimpat  his seizures were predictably occurring in the early morning or evening hours when he was in bed.  Xcopri was added recently with dose increased to 100 mg nightly a few weeks ago.  His seizures have been shorter when they occur but have been less predictable in timing.  His last seizure prior to this presentation was on Christmas Day at which time family was able to catch him.    Prior antiseizure medications: Keppra, not tolerated due to severe behavior issues Lamictal, family feels this was actually well-tolerated but ultimately became  ineffective Depakote, per family resulted in liver issues Ketogenic diet, unable to adhere to this diet Family is uncertain if he has tried other medications [facility] recommendations included potential trial of Topamax or Zonegran)  Hunt and Hess score 3   NIHSS components Score: Comment  1a Level of Conscious 0[]  1[x]  2[]  3[]      1b LOC Questions 0[]  1[]  2[x]       1c LOC Commands 0[]  1[]  2[x]       2 Best Gaze 0[x]  1[]  2[]       3 Visual 0[]  1[]  2[]  3[x]      4 Facial Palsy 0[x]  1[]  2[]  3[]      5a Motor Arm - left 0[]  1[]  2[x]  3[]  4[]  UN[]    5b Motor Arm - Right 0[]  1[]  2[x]  3[]  4[]  UN[]    6a Motor Leg - Left 0[]  1[x]  2[]  3[]  4[]  UN[]    6b Motor Leg - Right 0[]  1[x]  2[]  3[]  4[]  UN[]    7 Limb Ataxia 0[x]  1[]  2[]  3[]  UN[]     8 Sensory 0[x]  1[]  2[]  UN[]      9 Best Language 0[]  1[]  2[]  3[x]      10 Dysarthria 0[]  1[]  2[x]  UN[]      11 Extinct. and Inattention 0[x]  1[]  2[]       TOTAL:       ROS  Unable to ascertain due to mental status   Past History   Past Medical History:  Diagnosis Date   Autism    Intermittent explosive disorder    Seizures (HCC)     Past Surgical History:  Procedure Laterality  Date   arm fracture     due to seizures   brain surgery     broken leg     CRANIOTOMY FOR SUBDURAL IMPLANTATION OF ELECTRODE ARRAY     MASS EXCISION Right 09/14/2022   Procedure: EXCISION MASS RIGHT SHOULDER;  Surgeon: Eletha Boas, MD;  Location: [facility];  Service: General;  Laterality: Right;   OTHER SURGICAL HISTORY     Bilateral Frontal Sub- Pial Resections    Family History: Family History  Problem Relation Age of Onset   Migraines Mother        Intractable Migraines   Hypothyroidism Mother    Stroke Maternal Grandfather        Died at 61   Alcohol abuse Neg Hx     Social History  reports that he has never smoked. He has never used smokeless tobacco. He reports that he does not drink alcohol and does not use drugs.  Allergies   Allergen Reactions   Dairy Aid [Tilactase] Rash    Opioid effect     Medications   Current Facility-Administered Medications:    [START ON 09/07/2023] Chlorhexidine  Gluconate Cloth 2 % PADS 6 each, 6 each, Topical, Daily, Haward Pope L, MD   docusate sodium  (COLACE) capsule 100 mg, 100 mg, Oral, BID PRN, Dewald, Jonathan B, MD   hydrALAZINE  (APRESOLINE ) injection 10-40 mg, 10-40 mg, Intravenous, Q4H PRN, Emilio, John D, PA-C   ondansetron  (ZOFRAN ) injection 4 mg, 4 mg, Intravenous, Q6H PRN, Kara Dorn NOVAK, MD   Oral care mouth rinse, 15 mL, Mouth Rinse, PRN, Perris Conwell L, MD   polyethylene glycol (MIRALAX  / GLYCOLAX ) packet 17 g, 17 g, Oral, Daily PRN, Kara Dorn NOVAK, MD  Facility-Administered Medications Ordered in Other Encounters:    gadobenate dimeglumine  (MULTIHANCE ) injection 11 mL, 11 mL, Intravenous, Once, Radiologist, Medication, MD  Vitals   Vitals:   2023/10/04 1815 2023-10-04 1938 10/04/23 2000 10/04/23 2100  BP: 135/80  127/66   Pulse: 89  (!) 105   Resp: 17  19   Temp:  99.1 F (37.3 C)  99.6 F (37.6 C)  TempSrc:  Axillary  Axillary  SpO2: 95%  95%     There is no height or weight on file to calculate BMI.  Physical Exam   Constitutional: Appears thin Psych: Too sleepy to assess, remains calm when briefly awakened  Eyes: No scleral injection.  HENT: No OP obstruction. Posterior hematoma  Cardiovascular: Tachy, regular rhythm.  Respiratory: Effort normal, non-labored breathing.  GI: Soft.  No distension. There is no tenderness.   Neurologic Examination   Neuro: Mental Status: Open eyes to voice does not clearly follow any commands but is responsive to touch  Cranial Nerves: II: Unclear blink to threat. Pupils are equal, round, and reactive to light.   III,IV, VI/VIII: Intermittent roving eye movements, orients to examiner voice bilaterally  V/VII: Facial sensation is symmetric to eyelash brush XII: Unable to assess tongue protrusion  secondary to patient's mental status  Motor/Sensory: Tone is normal. Bulk is normal. Moves bilateral UE and LE grossly equally to light touch  Deep Tendon Reflexes: 2+ and symmetric biceps and patellae Cerebellar: Unable to assess secondary to patient's mental status    Labs/Imaging/Neurodiagnostic studies   CBC:  Recent Labs  Lab 2023/10/04 1447  WBC 16.0*  NEUTROABS 13.7*  HGB 15.7  HCT 44.6  MCV 94.3  PLT 169   Basic Metabolic Panel:  Lab Results  Component Value Date   NA  137 09/06/2023   K 3.5 09/06/2023   CO2 19 (L) 09/06/2023   GLUCOSE 123 (H) 09/06/2023   BUN 12 09/06/2023   CREATININE 0.81 09/06/2023   CALCIUM 9.2 09/06/2023   GFRNONAA >60 09/06/2023   GFRAA >60 10/25/2018    INR  Lab Results  Component Value Date   INR 1.1 08/28/2022   APTT  Lab Results  Component Value Date   APTT 38 (H) 08/28/2022   AED levels: No results found for: PHENYTOIN, ZONISAMIDE, LAMOTRIGINE, LEVETIRACETA  CT Head and CT C-spine without contrast(Personally reviewed): 1. Acute bifrontal hemorrhagic contusions. 2. Acute bilateral subdural hematomas. Subdural versus epidural hematoma over the left parietal convexity. No midline shift. 3. Small volume acute subarachnoid hemorrhage. 4. No acute cervical spine fracture.   Neurodiagnostics EEG:  pending  ASSESSMENT   PHUC KLUTTZ is a 28 y.o. male  has a past medical history of Autism, Intermittent explosive disorder, and Seizures (HCC).   Discussed with family, they would prefer to go back to prior dose of Vimpat  rather than continue cenobamate at this time (which is also not available IV and he is not currently safe to swallow) As a second agent could consider phenobarb, noting it does have a significant interaction with send elevate which can increase phenobarbital levels.  Cenobamate half-life is approximately 35 to 60 hours Family does not want use of Keppra, they would consider reinitiation of lamotrigine as  well but this would not be rapidly titratable in the inpatient setting  Goals of care were addressed with significant potential for worsening seizure frequency in the setting of intracranial hemorrhage.  Family confirmed that short-term intubation for seizure control would be within goals of care if needed  In a pharmacokinetic study described in the cenobamate prescribing information, coadministration of cenobamate (200 mg daily) and phenobarbital increased the phenobarbital mean AUC and Cmax 37% and 34%, respectively.1 Phenobarbital did not have any significant effect on cenobamate exposure.   RECOMMENDATIONS  -Agree with repeat head CT -Overnight EEG -CK, trend to peak  -Hold cenobamate for now -Give additional 150 mg vimpat  x 1 dose (after checking EKG, pending), then 250 mg every morning and 300 mg nightly -- last prior stable home dose prior to cenobamate addition per family -Appreciate comanagement by critical care team and neurosurgery -Neurology will follow along closely ______________________________________________________________________    Bonney Lola Jernigan MD-PhD [facility] [phone]   CRITICAL CARE Performed by: Lola LITTIE Jernigan   Total critical care time: 70 minutes  Critical care time was exclusive of separately billable procedures and treating other patients.  Critical care was necessary to treat or prevent imminent or life-threatening deterioration.  Critical care was time spent personally by me on the following activities: development of treatment plan with patient and/or surrogate as well as nursing, discussions with consultants, evaluation of patient's response to treatment, examination of patient, obtaining history from patient or surrogate, ordering and performing treatments and interventions, ordering and review of laboratory studies, ordering and review of radiographic studies, pulse oximetry and re-evaluation of patient's  condition.

## 2023-09-06 NOTE — H&P (Addendum)
 NAME:  Roy Price, MRN:  [id number], DOB:  [birth year], LOS: 0 ADMISSION DATE:  09/06/2023, CONSULTATION DATE:  1/2 REFERRING MD:  Dr. Cottie, CHIEF COMPLAINT:  SDH   History of Present Illness:  Patient is a 28 yo M w/ pertinent PMH autism, seizure disorder presents to [facility] on 1/2 w/ SDH.  Patient has hx of seizure disorder on vimpat  and cenobamate. Will have about one seizure a week at baseline. Patient is minimally to nonverbal. Mother states on 1/2 went out shopping and he got overstimulated. Then had 2 back to back seizure and fell hitting his head on ground. Came to [facility] ED for further eval. BP 139/90. Afebrile. On arrival patient nonverbal. CXR no acute abnormality. CT head/spine acute bifrontal hemorrhagic contusions and bilateral SDH; Small volume of acute SAH. NSG consult but no surgical intervention at this time. Recommend neuro consult and admit to neuro icu. While in ED had seizure episode requiring ativan . Given Vimpat  IV. PCCM consulted for icu admission.    Pertinent  Medical History   Past Medical History:  Diagnosis Date   Autism    Intermittent explosive disorder    Seizures (HCC)      Significant Hospital Events: Including procedures, antibiotic start and stop dates in addition to other pertinent events   1/2 admit w/ seizure and SDH  Interim History / Subjective:  See above  Objective   Blood pressure 135/80, pulse 89, temperature 98.4 F (36.9 C), temperature source Oral, resp. rate 17, SpO2 95%.       No intake or output data in the 24 hours ending 09/06/23 1909 There were no vitals filed for this visit.  Examination: General:  NAD HEENT: MM pink/moist; [place] in place Neuro: mae spontaneously; perrl CV: s1s2, RRR, no m/r/g PULM:  dim clear BS bilaterally;  GI: soft, bsx4 active  Extremities: warm/dry, no edema  Skin: no rashes or lesions    Resolved Hospital Problem list     Assessment & Plan:   Fall w/ SDH Seizure Hx of autism: minimally  to nonverbal at baseline -hx of seizures on vimpat  and cenobamate; unable to use keppra per father Plan: -neuro and nsg consulted; appreciate recs -BP goals per nsg -eeg -AEDS per neuro -seizure precautions -aspiration precuations -limit sedating meds -frequent neuro checks -Continue neuroprotective measures- normothermia, euglycemia, HOB greater than 30, head in neutral alignment, normocapnia, normoxia  Possible aspiration? Plan: -CXR unremarkable; given rocephin /doxy  -will hold on further abx   Leukocytosis Plan: -likely reactive from seizure -unasyn  as above -check ua -trend wbc/fever curve      Best Practice (right click and Reselect all SmartList Selections daily)   Diet/type: NPO DVT prophylaxis SCD Pressure ulcer(s): N/A GI prophylaxis: N/A Lines: N/A Foley:  N/A Code Status:  full code Last date of multidisciplinary goals of care discussion [1/2 father/mother updated at bedside]  Labs   CBC: Recent Labs  Lab 09/06/23 1447  WBC 16.0*  NEUTROABS 13.7*  HGB 15.7  HCT 44.6  MCV 94.3  PLT 169    Basic Metabolic Panel: Recent Labs  Lab 09/06/23 1447  NA 137  K 3.5  CL 102  CO2 19*  GLUCOSE 123*  BUN 12  CREATININE 0.81  CALCIUM 9.2  MG 1.8   GFR: CrCl cannot be calculated (Unknown ideal weight.). Recent Labs  Lab 09/06/23 1447  WBC 16.0*    Liver Function Tests: No results for input(s): AST, ALT, ALKPHOS, BILITOT, PROT, ALBUMIN in the last 168 hours. No  results for input(s): LIPASE, AMYLASE in the last 168 hours. No results for input(s): AMMONIA in the last 168 hours.  ABG No results found for: PHART, PCO2ART, PO2ART, HCO3, TCO2, ACIDBASEDEF, O2SAT   Coagulation Profile: No results for input(s): INR, PROTIME in the last 168 hours.  Cardiac Enzymes: No results for input(s): CKTOTAL, CKMB, CKMBINDEX, TROPONINI in the last 168 hours.  HbA1C: No results found for:  HGBA1C  CBG: No results for input(s): GLUCAP in the last 168 hours.  Review of Systems:   Patient is encephalopathic; therefore, history has been obtained from chart review.    Past Medical History:  He,  has a past medical history of Autism, Intermittent explosive disorder, and Seizures (HCC).   Surgical History:   Past Surgical History:  Procedure Laterality Date   arm fracture     due to seizures   brain surgery     broken leg     CRANIOTOMY FOR SUBDURAL IMPLANTATION OF ELECTRODE ARRAY     MASS EXCISION Right 09/14/2022   Procedure: EXCISION MASS RIGHT SHOULDER;  Surgeon: Eletha Boas, MD;  Location: [facility];  Service: General;  Laterality: Right;   OTHER SURGICAL HISTORY     Bilateral Frontal Sub- Pial Resections     Social History:   reports that he has never smoked. He has never used smokeless tobacco. He reports that he does not drink alcohol and does not use drugs.   Family History:  His family history includes Hypothyroidism in his mother; Migraines in his mother; Stroke in his maternal grandfather. There is no history of Alcohol abuse.   Allergies Allergies  Allergen Reactions   Dairy Aid [Tilactase] Rash    Opioid effect      Home Medications  Prior to Admission medications   Medication Sig Start Date End Date Taking? Authorizing Provider  cholecalciferol (VITAMIN D ) 400 UNITS TABS Take 2,000 Units by mouth at bedtime.     [provider]  co-enzyme Q-10 30 MG capsule Take 30 mg by mouth at bedtime.     [provider]  Lacosamide  (VIMPAT ) 100 MG TABS Take 150-300 mg by mouth See admin instructions. Take 300 mg by mouth in the morning and then take 150 mg in the evening    [provider]  Omega-3 Fatty Acids (FISH OIL PO) Take 1 capsule by mouth daily. gummies     [provider]  traMADol  (ULTRAM ) 50 MG tablet Take 1-2 tablets (50-100 mg total) by mouth every 6 (six) hours as needed for moderate pain  or severe pain. 09/14/22   Eletha Boas, MD  vitamin C  (ASCORBIC ACID) 500 MG tablet Take 500 mg by mouth daily.    [provider]  vitamin E  400 UNIT capsule Take 400 Units by mouth daily.    [provider]     Critical care time: 45 minutes    JD Emilio RIGGERS [facility] 09/06/2023, 7:09 PM  Please see [email or web address] for pager details.  From 7A-7P if no response, please call [phone]. After hours, please call ELink [phone].

## 2023-09-06 NOTE — ED Notes (Signed)
 Pt given 1mg  Ativan per MD Trifan verbal order.  Pt was seizing with father at bedside.  Rodney Booze, RN to waste 1mg  Ativan left over with this RN.

## 2023-09-07 ENCOUNTER — Encounter (HOSPITAL_COMMUNITY): Payer: BC Managed Care – PPO

## 2023-09-07 DIAGNOSIS — R569 Unspecified convulsions: Secondary | ICD-10-CM | POA: Diagnosis not present

## 2023-09-07 DIAGNOSIS — I629 Nontraumatic intracranial hemorrhage, unspecified: Secondary | ICD-10-CM

## 2023-09-07 LAB — CK: Total CK: 166 U/L (ref 49–397)

## 2023-09-07 LAB — CBC
HCT: 40.9 % (ref 39.0–52.0)
Hemoglobin: 14.6 g/dL (ref 13.0–17.0)
MCH: 33.6 pg (ref 26.0–34.0)
MCHC: 35.7 g/dL (ref 30.0–36.0)
MCV: 94.2 fL (ref 80.0–100.0)
Platelets: 142 10*3/uL — ABNORMAL LOW (ref 150–400)
RBC: 4.34 MIL/uL (ref 4.22–5.81)
RDW: 11.6 % (ref 11.5–15.5)
WBC: 11.5 10*3/uL — ABNORMAL HIGH (ref 4.0–10.5)
nRBC: 0 % (ref 0.0–0.2)

## 2023-09-07 LAB — URINALYSIS, ROUTINE W REFLEX MICROSCOPIC
Bilirubin Urine: NEGATIVE
Glucose, UA: NEGATIVE mg/dL
Hgb urine dipstick: NEGATIVE
Ketones, ur: 5 mg/dL — AB
Leukocytes,Ua: NEGATIVE
Nitrite: NEGATIVE
Protein, ur: 30 mg/dL — AB
Specific Gravity, Urine: 1.017 (ref 1.005–1.030)
pH: 5 (ref 5.0–8.0)

## 2023-09-07 LAB — MAGNESIUM: Magnesium: 2.2 mg/dL (ref 1.7–2.4)

## 2023-09-07 LAB — BASIC METABOLIC PANEL
Anion gap: 11 (ref 5–15)
BUN: 12 mg/dL (ref 6–20)
CO2: 21 mmol/L — ABNORMAL LOW (ref 22–32)
Calcium: 9.1 mg/dL (ref 8.9–10.3)
Chloride: 106 mmol/L (ref 98–111)
Creatinine, Ser: 0.8 mg/dL (ref 0.61–1.24)
GFR, Estimated: >60 mL/min (ref >60)
Glucose, Bld: 95 mg/dL (ref 70–99)
Potassium: 3.8 mmol/L (ref 3.5–5.1)
Sodium: 138 mmol/L (ref 135–145)

## 2023-09-07 LAB — MRSA NEXT GEN BY PCR, NASAL: MRSA by PCR Next Gen: NOT DETECTED

## 2023-09-07 LAB — GLUCOSE, CAPILLARY
Glucose-Capillary: 101 mg/dL — ABNORMAL HIGH (ref 70–99)
Glucose-Capillary: 108 mg/dL — ABNORMAL HIGH (ref 70–99)
Glucose-Capillary: 111 mg/dL — ABNORMAL HIGH (ref 70–99)
Glucose-Capillary: 160 mg/dL — ABNORMAL HIGH (ref 70–99)
Glucose-Capillary: 94 mg/dL (ref 70–99)
Glucose-Capillary: 98 mg/dL (ref 70–99)

## 2023-09-07 LAB — ABO/RH: ABO/RH(D): O POS

## 2023-09-07 LAB — PHOSPHORUS: Phosphorus: 2.1 mg/dL — ABNORMAL LOW (ref 2.5–4.6)

## 2023-09-07 LAB — TYPE AND SCREEN
ABO/RH(D): O POS
Antibody Screen: NEGATIVE

## 2023-09-07 MED ORDER — ACETAMINOPHEN 325 MG PO TABS
650.0000 mg | ORAL_TABLET | Freq: Four times a day (QID) | ORAL | Status: DC | PRN
  Administered 2023-09-07 – 2023-09-09 (×4): 650 mg via ORAL
  Filled 2023-09-07 (×4): qty 2

## 2023-09-07 MED ORDER — LACOSAMIDE 50 MG PO TABS
300.0000 mg | ORAL_TABLET | Freq: Two times a day (BID) | ORAL | Status: DC
  Administered 2023-09-07 – 2023-09-09 (×4): 300 mg via ORAL
  Filled 2023-09-07 (×5): qty 6

## 2023-09-07 MED ORDER — SODIUM CHLORIDE 0.9 % IV SOLN: INTRAVENOUS | Status: DC | PRN

## 2023-09-07 NOTE — Progress Notes (Signed)
 LTM EEG running - no initial skin breakdown - push button tested - neuro notified. [facility] NOTIFIED Hu charge captured

## 2023-09-07 NOTE — Procedures (Addendum)
 Patient Name: IZEK CORVINO  MRN: [id number]  Epilepsy Attending: Arlin MALVA Krebs  Referring Physician/Provider: Jerrie Lola CROME, MD  Duration: 09/07/2023 0051 to 1301  Patient history: 28 year old male with breakthrough seizures getting EEG to evaluate for seizures.  He  Level of alertness: Awake, asleep  AEDs during EEG study: LCM  Technical aspects: This EEG study was done with scalp electrodes positioned according to the 10-20 International system of electrode placement. Electrical activity was reviewed with band pass filter of 1-70Hz , sensitivity of 7 uV/mm, display speed of 15mm/sec with a 60Hz  notched filter applied as appropriate. EEG data were recorded continuously and digitally stored.  Video monitoring was available and reviewed as appropriate.  Description: The posterior dominant rhythm consists of 9 Hz activity of moderate voltage (25-35 uV) seen predominantly in posterior head regions, symmetric and reactive to eye opening and eye closing. Sleep was characterized by vertex waves, sleep spindles (12 to 14 Hz), maximal frontocentral region. EEG showed continuous generalized polymorphic 3 to 6 Hz theta-delta slowing.  Intermittent spikes were noted in left more than right frontotemporal region.  Hyperventilation and photic stimulation were not performed.     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  Additionally there is moderate diffuse encephalopathy which is likely baseline given patient's history. No seizures were seen throughout the recording.  Karletta Millay O Karanvir Balderston

## 2023-09-07 NOTE — Progress Notes (Signed)
 NAME:  Roy Price, MRN:  [id number], DOB:  06-18-1996, LOS: 1 ADMISSION DATE:  09/06/2023, CONSULTATION DATE:  1/2 REFERRING MD:  Dr. Cottie, CHIEF COMPLAINT:  SDH   History of Present Illness:  Patient is a 28 yo M w/ pertinent PMH autism, seizure disorder presents to [facility] on 1/2 w/ SDH.  Patient has hx of seizure disorder on vimpat  and cenobamate. Will have about one seizure a week at baseline. Patient is minimally to nonverbal. Mother states on 1/2 went out shopping and he got overstimulated. Then had 2 back to back seizure and fell hitting his head on ground. Came to [facility] ED for further eval. BP 139/90. Afebrile. On arrival patient nonverbal. CXR no acute abnormality. CT head/spine acute bifrontal hemorrhagic contusions and bilateral SDH; Small volume of acute SAH. NSG consult but no surgical intervention at this time. Recommend neuro consult and admit to neuro icu. While in ED had seizure episode requiring ativan . Given Vimpat  IV. PCCM consulted for icu admission.   Pertinent  Medical History   Past Medical History:  Diagnosis Date   Autism    Intermittent explosive disorder    Seizures (HCC)    Significant Hospital Events: Including procedures, antibiotic start and stop dates in addition to other pertinent events   1/2 admit w/ seizure and SDH 1/3 No issues overnight, awaiting am labs   Interim History / Subjective:  Patient lying in bed at baseline mentation per dad   Objective   Blood pressure 116/65, pulse (!) 110, temperature 98.4 F (36.9 C), temperature source Axillary, resp. rate (!) 26, weight 59.7 kg, SpO2 95%.        Intake/Output Summary (Last 24 hours) at 09/07/2023 0707 Last data filed at 09/07/2023 0600 Gross per 24 hour  Intake 382.06 ml  Output 411 ml  Net -28.94 ml   Filed Weights   09/07/23 0500  Weight: 59.7 kg    Examination: General: Well appearing adult male lying in bed in NAD HEENT: [place]/AT, MM pink/moist, PERRL,  Neuro: Alert and interactive  per baseline  CV: s1s2 regular rate and rhythm, no murmur, rubs, or gallops,  PULM:  Clear to auscultation, no increased work of breathing, no added breath sounds  GI: soft, bowel sounds active in all 4 quadrants, non-tender, non-distended Extremities: warm/dry, no edema  Skin: no rashes or lesions  Resolved Hospital Problem list     Assessment & Plan:   Fall w/ SDH Seizure disorder  -hx of seizures on vimpat  and cenobamate; unable to use keppra per father Hx of autism -minimally to nonverbal at baseline P: Neurology and NSGY following, appreciate assistance  Maintain neuro protective measures; goal for eurothermia, euglycemia, eunatermia, normoxia, and PCO2 goal of 35-40 Nutrition and bowel regiment  Seizure precautions  AEDs per neurology  Aspirations precautions  BP goal per NSGY LTM per neuro  Frequent neuro checks   Possible aspiration with leukocytosis  -CXR unremarkable, elevated WBC likely stress reaction  P: Continue to monitor off antibiotics   At risk SIADH given SDH  P: Trend BMP  Follow urine output   Best Practice (right click and Reselect all SmartList Selections daily)   Diet/type: NPO DVT prophylaxis SCD Pressure ulcer(s): N/A GI prophylaxis: N/A Lines: N/A Foley:  N/A Code Status:  full code Last date of multidisciplinary goals of care discussion [1/2 father/mother updated at bedside]  Critical care time: NA   Meaghan Whistler D. [place]lo[place], NP-C [facility] Personal contact information can be found on  Amion  If no contact or response made please call [phone] 09/07/2023, 7:52 AM

## 2023-09-07 NOTE — Progress Notes (Signed)
    Providing Compassionate, Quality Care - Together   NEUROSURGERY PROGRESS NOTE     S: No issues overnight.    O: EXAM:  BP 116/65   Pulse (!) 110   Temp 98 F (36.7 C) (Axillary)   Resp (!) 26   Wt 59.7 kg   SpO2 95%   BMI 18.88 kg/m     Awake, alert Minimally verbal at baseline MAEs, FC PERRLA   ASSESSMENT:  28 y.o. with autism, seizure disorder who had recent seizure with head trauma, multiple brain contusions with associated small extraaxial hematoma     PLAN: -SBP <140 -Appreciate CCM/Neurology mgmt -Cont supportive care -Call w/ questions/concerns.   Camie Pickle, [facility]

## 2023-09-07 NOTE — Progress Notes (Addendum)
 Subjective: No further clinical seizures.  MND:Lwjaoz to obtain due to poor mental status  Examination  Vital signs in last 24 hours: Temp:  [98 F (36.7 C)-99.7 F (37.6 C)] 99.6 F (37.6 C) (01/03 1200) Pulse Rate:  [88-115] 96 (01/03 1100) Resp:  [15-30] 18 (01/03 1100) BP: (105-139)/(62-90) 126/72 (01/03 1100) SpO2:  [94 %-100 %] 94 % (01/03 1100) Weight:  [59.7 kg] 59.7 kg (01/03 0500)  General: lying in bed, NAD Neuro: Opens eyes to verbal stimulation, per staff and parents at bedside, patient has been moving all 4 extremities.  Basic Metabolic Panel: Recent Labs  Lab 09/06/23 1447 09/07/23 0844  NA 137 138  K 3.5 3.8  CL 102 106  CO2 19* 21*  GLUCOSE 123* 95  BUN 12 12  CREATININE 0.81 0.80  CALCIUM 9.2 9.1  MG 1.8 2.2  PHOS  --  2.1*    CBC: Recent Labs  Lab 09/06/23 1447 09/07/23 0844  WBC 16.0* 11.5*  NEUTROABS 13.7*  --   HGB 15.7 14.6  HCT 44.6 40.9  MCV 94.3 94.2  PLT 169 142*     Coagulation Studies: Recent Labs    09/06/23 08-24-17  LABPROT 15.6*  INR 1.2    Imaging personally reviewed   CT head without contrast 09/06/2023: No substantial change in extensive multifocal multi compartment acute hemorrhage. No progressive mass effect. No hydrocephalus.      ASSESSMENT AND PLAN: 28 year old male with history of medically refractory epilepsy, relatively well-controlled on Vimpat  300 mg twice daily with average frequency of 1 seizure per week who presented with breakthrough seizures leading to a fall and brain contusions, extra axial hematoma.  Epilepsy with breakthrough seizure -No further seizures overnight -Had extensive discussion with patient's bed at bedside.  States patient was relatively well-controlled on Vimpat  30 mg twice daily.  Xcopri was recently started and titrated up to 100 mg daily.  Per mother at bedside, patient did well during the first week of Xcopri.  However after that his behavior gradually worsened.  She also noticed  the seizure duration had improved but seizures were now happening at different times of the day rather than early in the morning or late at night like they used to happen when he was typically in bed and therefore lessen risk of injury -Family would prefer to be back on Vimpat  300 mg twice daily.  I agree with this plan -Will defer further management to patient's primary neurologist Dr. Reta -Patient's father who is also a neurosurgeon also inquired about VNS placement.  He will speak with Dr. Lanis or neurosurgeon to further discuss the risks of VNS implantation and if they agree, we will proceed with this -Continue seizure precautions -DC LTM EEG as no further seizures -Management of contusions and hematoma per neurosurgery team -Neurology will sign off.  Please call us  for any further questions  Seizure precautions: Per   [place]V statutes, patients with seizures are not allowed to drive until they have been seizure-free for six months and cleared by a physician    Use caution when using heavy equipment or power tools. Avoid working on ladders or at heights. Take showers instead of baths. Ensure the water temperature is not too high on the home water heater. Do not go swimming alone. Do not lock yourself in a room alone (i.e. bathroom). When caring for infants or small children, sit down when holding, feeding, or changing them to minimize risk of injury to the child in the event you  have a seizure. Maintain good sleep hygiene. Avoid alcohol.    If patient has another seizure, call 911 and bring them back to the ED if: A.  The seizure lasts longer than 5 minutes.      B.  The patient doesn't wake shortly after the seizure or has new problems such as difficulty seeing, speaking or moving following the seizure C.  The patient was injured during the seizure D.  The patient has a temperature over 102 F (39C) E.  The patient vomited during the seizure and now is having trouble  breathing    During the Seizure   - First, ensure adequate ventilation and place patients on the floor on their left side  Loosen clothing around the neck and ensure the airway is patent. If the patient is clenching the teeth, do not force the mouth open with any object as this can cause severe damage - Remove all items from the surrounding that can be hazardous. The patient may be oblivious to what's happening and may not even know what he or she is doing. If the patient is confused and wandering, either gently guide him/her away and block access to outside areas - Reassure the individual and be comforting - Call 911. In most cases, the seizure ends before EMS arrives. However, there are cases when seizures may last over 3 to 5 minutes. Or the individual may have developed breathing difficulties or severe injuries. If a pregnant patient or a person with diabetes develops a seizure, it is prudent to call an ambulance.   After the Seizure (Postictal Stage)   After a seizure, most patients experience confusion, fatigue, muscle pain and/or a headache. Thus, one should permit the individual to sleep. For the next few days, reassurance is essential. Being calm and helping reorient the person is also of importance.   Most seizures are painless and end spontaneously. Seizures are not harmful to others but can lead to complications such as stress on the lungs, brain and the heart. Individuals with prior lung problems may develop labored breathing and respiratory distress.     I have spent a total of  37  minutes with the patient reviewing hospital notes,  test results, labs and examining the patient as well as establishing an assessment and plan.  > 50% of time was spent in direct patient care.         Arlin Krebs Epilepsy [facility] For questions after 5pm please refer to AMION to reach the Neurologist on call

## 2023-09-07 NOTE — Progress Notes (Signed)
 LTM EEG discontinued - no skin breakdown at [facility].

## 2023-09-08 ENCOUNTER — Inpatient Hospital Stay (HOSPITAL_COMMUNITY): Payer: BC Managed Care – PPO

## 2023-09-08 DIAGNOSIS — R569 Unspecified convulsions: Secondary | ICD-10-CM | POA: Diagnosis not present

## 2023-09-08 DIAGNOSIS — I629 Nontraumatic intracranial hemorrhage, unspecified: Secondary | ICD-10-CM | POA: Diagnosis not present

## 2023-09-08 LAB — BASIC METABOLIC PANEL
Anion gap: 8 (ref 5–15)
BUN: 11 mg/dL (ref 6–20)
CO2: 25 mmol/L (ref 22–32)
Calcium: 9 mg/dL (ref 8.9–10.3)
Chloride: 107 mmol/L (ref 98–111)
Creatinine, Ser: 0.8 mg/dL (ref 0.61–1.24)
GFR, Estimated: >60 mL/min (ref >60)
Glucose, Bld: 117 mg/dL — ABNORMAL HIGH (ref 70–99)
Potassium: 3.6 mmol/L (ref 3.5–5.1)
Sodium: 140 mmol/L (ref 135–145)

## 2023-09-08 LAB — GLUCOSE, CAPILLARY
Glucose-Capillary: 115 mg/dL — ABNORMAL HIGH (ref 70–99)
Glucose-Capillary: 116 mg/dL — ABNORMAL HIGH (ref 70–99)
Glucose-Capillary: 127 mg/dL — ABNORMAL HIGH (ref 70–99)
Glucose-Capillary: 134 mg/dL — ABNORMAL HIGH (ref 70–99)
Glucose-Capillary: 135 mg/dL — ABNORMAL HIGH (ref 70–99)

## 2023-09-08 NOTE — Progress Notes (Signed)
   NAME:  Roy Price, MRN:  [id number], DOB:  [birth year], LOS: 2 ADMISSION DATE:  09/06/2023, CONSULTATION DATE:  1/2 REFERRING MD:  Dr. Cottie, CHIEF COMPLAINT:  SDH   History of Present Illness:  Patient is a 28 yo M w/ pertinent PMH autism, seizure disorder presents to [facility] on 1/2 w/ SDH.  Patient has hx of seizure disorder on vimpat  and cenobamate. Will have about one seizure a week at baseline. Patient is minimally to nonverbal. Mother states on 1/2 went out shopping and he got overstimulated. Then had 2 back to back seizure and fell hitting his head on ground. Came to [facility] ED for further eval. BP 139/90. Afebrile. On arrival patient nonverbal. CXR no acute abnormality. CT head/spine acute bifrontal hemorrhagic contusions and bilateral SDH; Small volume of acute SAH. NSG consult but no surgical intervention at this time. Recommend neuro consult and admit to neuro icu. While in ED had seizure episode requiring ativan . Given Vimpat  IV. PCCM consulted for icu admission.   Pertinent  Medical History   Past Medical History:  Diagnosis Date   Autism    Intermittent explosive disorder    Seizures (HCC)    Significant Hospital Events: Including procedures, antibiotic start and stop dates in addition to other pertinent events   1/2 admit w/ seizure and SDH 1/3 No issues overnight, awaiting am labs   Interim History / Subjective:  No overnight issues Repeat head CT this morning showing evolution of bifrontal contusion with slightly more effacement of left frontal horn EEG was stopped, no more seizures  Objective   Blood pressure 118/81, pulse 95, temperature 98.1 F (36.7 C), temperature source Axillary, resp. rate 19, weight 59.7 kg, SpO2 96%.        Intake/Output Summary (Last 24 hours) at 09/08/2023 0740 Last data filed at 09/07/2023 2100 Gross per 24 hour  Intake 764.97 ml  Output 700 ml  Net 64.97 ml   Filed Weights   09/07/23 0500  Weight: 59.7 kg    Examination: Physical  exam: General: Young male, lying on the bed HEENT: [place]/AT, eyes anicteric.  moist mucus membranes Neuro: Awake, responding to yes and no Chest: Coarse breath sounds, no wheezes or rhonchi Heart: Regular rate and rhythm, no murmurs or gallops Abdomen: Soft, nontender, nondistended, bowel sounds present Skin: No rash  Labs and images reviewed  Resolved Hospital Problem list     Assessment & Plan:  Status post fall with bilateral subdural hemorrhage Bifrontal evolving contusion Epilepsy with breakthrough seizures Autism Hypophosphatemia Continue neuro watch Repeat head CT this morning showing evolving bifrontal contusion and is stable bilateral subdural hemorrhage Continue Vimpat  300 mg twice daily EEG showed no active seizures Outpatient follow-up with neurology Patient remained afebrile, white count is trending down Remain off antibiotics PT/OT evaluation   Best Practice (right click and Reselect all SmartList Selections daily)   Diet/type: Regular diet DVT prophylaxis SCD Pressure ulcer(s): N/A GI prophylaxis: N/A Lines: N/A Foley:  N/A Code Status:  full code Last date of multidisciplinary goals of care discussion [1/4 father updated at bedside]    Valinda Novas, MD [facility] See Amion for pager If no response to pager, please call [phone] until 7pm After 7pm, Please call E-link [phone]

## 2023-09-08 NOTE — Progress Notes (Signed)
  NEUROSURGERY PROGRESS NOTE   Pt seen and examined. No issues overnight.   EXAM: Temp:  [98 F (36.7 C)-99.6 F (37.6 C)] 98 F (36.7 C) (01/04 0800) Pulse Rate:  [80-123] 80 (01/04 1000) Resp:  [13-34] 19 (01/04 1000) BP: (105-148)/(55-83) 135/72 (01/04 1000) SpO2:  [93 %-97 %] 97 % (01/04 1000) Intake/Output      01/03 0701 01/04 0700 01/04 0701 01/05 0700   P.O. 540    I.V. (mL/kg) 90 (1.5)    IV Piggyback 135    Total Intake(mL/kg) 765 (12.8)    Urine (mL/kg/hr) 650 (0.5)    Emesis/NG output 50    Stool     Total Output 700    Net +65         Urine Occurrence  1 x    Awake, alert Responds to simple questions FC, MAE well  LABS: Lab Results  Component Value Date   CREATININE 0.80 09/08/2023   BUN 11 09/08/2023   NA 140 09/08/2023   K 3.6 09/08/2023   CL 107 09/08/2023   CO2 25 09/08/2023   Lab Results  Component Value Date   WBC 11.5 (H) 09/07/2023   HGB 14.6 09/07/2023   HCT 40.9 09/07/2023   MCV 94.2 09/07/2023   PLT 142 (L) 09/07/2023    IMAGING: Repeat CTH this am reviewed demonstrating evolution of bifrontal contusions with slightly more effacement of the left frontal horn, no HCP.  IMPRESSION: - 28 y.o. male s/p fall after SZ with bifrontal contusions and small EDH, clinically appears to be stable, much better than his CT would predict.  PLAN: - Cont observation in ICU - Cont Vimpat    Gerldine Maizes, MD [facility]

## 2023-09-08 NOTE — Progress Notes (Signed)
 Patient is not nonverbal. Responds to all my questions appropriately.

## 2023-09-09 DIAGNOSIS — R569 Unspecified convulsions: Secondary | ICD-10-CM | POA: Diagnosis not present

## 2023-09-09 DIAGNOSIS — I629 Nontraumatic intracranial hemorrhage, unspecified: Secondary | ICD-10-CM | POA: Diagnosis not present

## 2023-09-09 LAB — GLUCOSE, CAPILLARY
Glucose-Capillary: 123 mg/dL — ABNORMAL HIGH (ref 70–99)
Glucose-Capillary: 117 mg/dL — ABNORMAL HIGH (ref 70–99)

## 2023-09-09 LAB — BASIC METABOLIC PANEL
Anion gap: 11 (ref 5–15)
BUN: 8 mg/dL (ref 6–20)
CO2: 26 mmol/L (ref 22–32)
Calcium: 9.1 mg/dL (ref 8.9–10.3)
Chloride: 104 mmol/L (ref 98–111)
Creatinine, Ser: 0.75 mg/dL (ref 0.61–1.24)
GFR, Estimated: >60 mL/min (ref >60)
Glucose, Bld: 115 mg/dL — ABNORMAL HIGH (ref 70–99)
Potassium: 3.4 mmol/L — ABNORMAL LOW (ref 3.5–5.1)
Sodium: 141 mmol/L (ref 135–145)

## 2023-09-09 MED ORDER — LACOSAMIDE 150 MG PO TABS: 300.0000 mg | ORAL_TABLET | Freq: Two times a day (BID) | ORAL | 0 refills | Status: AC

## 2023-09-09 MED ORDER — POTASSIUM CHLORIDE 20 MEQ PO PACK
40.0000 meq | PACK | Freq: Once | ORAL | Status: AC
  Administered 2023-09-09: 40 meq via ORAL
  Filled 2023-09-09: qty 2

## 2023-09-09 MED ORDER — POTASSIUM CHLORIDE CRYS ER 20 MEQ PO TBCR: 40.0000 meq | EXTENDED_RELEASE_TABLET | Freq: Once | ORAL | Status: DC

## 2023-09-09 NOTE — Progress Notes (Signed)
  NEUROSURGERY PROGRESS NOTE   Pt seen and examined. No issues overnight.   EXAM: Temp:  [98 F (36.7 C)-98.3 F (36.8 C)] 98.3 F (36.8 C) (01/05 0800) Pulse Rate:  [65-89] 85 (01/04 1700) Resp:  [13-37] 19 (01/05 0800) BP: (126-142)/(59-91) 139/86 (01/05 0800) SpO2:  [95 %-97 %] 95 % (01/04 1700) Intake/Output      01/04 0701 01/05 0700 01/05 0701 01/06 0700   P.O.     I.V. (mL/kg)     IV Piggyback     Total Intake(mL/kg)     Urine (mL/kg/hr)     Emesis/NG output     Total Output     Net          Urine Occurrence 1 x     Awake, alert Responds to simple questions FC, MAE well  LABS: Lab Results  Component Value Date   CREATININE 0.75 09/09/2023   BUN 8 09/09/2023   NA 141 09/09/2023   K 3.4 (L) 09/09/2023   CL 104 09/09/2023   CO2 26 09/09/2023   Lab Results  Component Value Date   WBC 11.5 (H) 09/07/2023   HGB 14.6 09/07/2023   HCT 40.9 09/07/2023   MCV 94.2 09/07/2023   PLT 142 (L) 09/07/2023     IMPRESSION: - 28 y.o. male s/p fall after SZ with bifrontal contusions and small EDH, remains clinically stable  PLAN: - Cont Vimpat  - Stable for d/c home from NS standpoint  Gerldine Maizes, MD [facility]

## 2023-09-09 NOTE — Progress Notes (Signed)
 [facility] ADULT ICU REPLACEMENT PROTOCOL   The patient does apply for the [facility] Adult ICU Electrolyte Replacment Protocol based on the criteria listed below:   1.Exclusion criteria: TCTS, ECMO, Dialysis, and Myasthenia Gravis patients 2. Is GFR >/= 30 ml/min? Yes.    Patient's GFR today is >60 3. Is SCr </= 2? Yes.   Patient's SCr is 0.75 mg/dL 4. Did SCr increase >/= 0.5 in 24 hours? No. 5.Pt's weight >40kg  Yes.   6. Abnormal electrolyte(s): K+ 3.4  7. Electrolytes replaced per protocol 8.  Call MD STAT for K+ </= 2.5, Phos </= 1, or Mag </= 1 Physician:  Dr. Fate Rummer, [place] ORN 09/09/2023 5:25 AM

## 2023-09-09 NOTE — Progress Notes (Signed)
 Discharge instructions reviewed with Father, Timm Bonenberger.

## 2023-09-09 NOTE — Discharge Summary (Signed)
 Physician Discharge Summary         Patient ID: JAMONE GARRIDO MRN: [id number] DOB/AGE: 09-14-1995 27 y.o.  Admit date: 09/06/2023 Discharge date: 09/09/2023  Discharge Diagnoses:   Status post fall with bilateral subdural hemorrhage Bifrontal evolving contusion Epilepsy with breakthrough seizures Autism Hypophosphatemia/hypokalemia   Discharge summary    Patient is a 28 yo M w/ pertinent PMH autism, seizure disorder presents to [facility] on 1/2 w/ SDH.   Patient has hx of seizure disorder on vimpat  and cenobamate. Will have about one seizure a week at baseline. Patient is minimally to nonverbal. Mother states on 1/2 went out shopping and he got overstimulated. Then had 2 back to back seizure and fell hitting his head on ground. Came to [facility] ED for further eval. BP 139/90. Afebrile. On arrival patient nonverbal. CXR no acute abnormality. CT head/spine acute bifrontal hemorrhagic contusions and bilateral SDH; Small volume of acute SAH and bifrontal. NSG consult but no surgical intervention at this time. Given Vimpat  IV. PCCM consulted for icu admission.  Neurology was consulted, patient was placed on EEG, neurology recommended continuing Vimpat  300 mg twice daily with outpatient seizures soft, EEG was discontinued.  CT head was repeated twice which showed evolving frontal lobe contusion and stable bilateral subdural and small subarachnoid hemorrhage.  Patient stable, now awake intermittently following commands. Electrolytes were supplemented  Discharge Plan by Active Problems    Continue Vimpat  300 mg twice daily and follow-up with primary neurologist   Significant Hospital tests/ studies   Procedures    Culture data/antimicrobials      Consults  Neurology Neurosurgery    Discharge Exam: BP 135/87   Pulse 85   Temp 98.3 F (36.8 C) (Axillary)   Resp (!) 23   Wt 59.7 kg   SpO2 95%   BMI 18.88 kg/m   Physical exam: General: Young male, lying on the bed HEENT: [place]/AT, eyes  anicteric.  moist mucus membranes Neuro: Awake, responding appropriately Chest: Coarse breath sounds, no wheezes or rhonchi Heart: Regular rate and rhythm, no murmurs or gallops Abdomen: Soft, nontender, nondistended, bowel sounds present Skin: No rash  Labs at discharge   Lab Results  Component Value Date   CREATININE 0.75 09/09/2023   BUN 8 09/09/2023   NA 141 09/09/2023   K 3.4 (L) 09/09/2023   CL 104 09/09/2023   CO2 26 09/09/2023   Lab Results  Component Value Date   WBC 11.5 (H) 09/07/2023   HGB 14.6 09/07/2023   HCT 40.9 09/07/2023   MCV 94.2 09/07/2023   PLT 142 (L) 09/07/2023   Lab Results  Component Value Date   ALT 19 09/06/2023   AST 36 09/06/2023   ALKPHOS 75 09/06/2023   BILITOT 0.7 09/06/2023   Lab Results  Component Value Date   INR 1.2 09/06/2023   INR 1.1 08/28/2022    Current radiological studies    CT HEAD WO CONTRAST ( ) Result Date: 09/08/2023 CLINICAL DATA:  Subarachnoid hemorrhage. EXAM: CT HEAD WITHOUT CONTRAST TECHNIQUE: Contiguous axial images were obtained from the base of the skull through the vertex without intravenous contrast. RADIATION DOSE REDUCTION: This exam was performed according to the departmental dose-optimization program which includes automated exposure control, adjustment of the mA and/or kV according to patient size and/or use of iterative reconstruction technique. COMPARISON:  Head CT 09/06/2023 FINDINGS: Brain: Acute hemorrhagic contusions in the frontal lobes have mildly enlarged, including a 4 cm hemorrhagic contusion on the left. Associated edema has mildly increased with  slightly increased mass effect on the frontal horn of the left lateral ventricle. There is no evidence of acute hydrocephalus. Small volume subdural hematomas over both cerebral convexities and along the falx and scattered subarachnoid hemorrhage have not significantly changed. An 8 mm thick epidural or subdural hematoma over the left parietal convexity is  unchanged. There is no midline shift or acute large territory infarct. Vascular: No hyperdense vessel. Skull: Bilateral craniotomies.  No definite acute fracture. Sinuses/Orbits: Paranasal sinuses and mastoid air cells are clear. Unremarkable orbits. Other: Decreased size of a posterior scalp hematoma. IMPRESSION: 1. Mild worsening of bifrontal hemorrhagic contusions. 2. Otherwise unchanged acute multicompartmental hemorrhage. Electronically Signed   By: Dasie Hamburg M.D.   On: 09/08/2023 12:41    Disposition:    Discharge disposition: 01-Home or Self Care         Allergies as of 09/09/2023       Reactions   Dairy Aid [tilactase] Rash   Opioid effect         Medication List     TAKE these medications    ascorbic acid 500 MG tablet Commonly known as: VITAMIN C  Take 500 mg by mouth daily.   cholecalciferol 10 MCG (400 UNIT) Tabs tablet Commonly known as: VITAMIN D3 Take 2,000 Units by mouth at bedtime.   co-enzyme Q-10 30 MG capsule Take 30 mg by mouth at bedtime.   Lacosamide  150 MG Tabs Take 2 tablets (300 mg total) by mouth 2 (two) times daily. What changed:  medication strength how much to take when to take this additional instructions   vitamin E  180 MG (400 UNITS) capsule Take 400 Units by mouth daily.   Xcopri 100 MG Tabs Generic drug: Cenobamate Take 1 tablet by mouth at bedtime.         Follow-up appointment   With primary neurologist  Discharge Condition:    stable  Physician Statement:   The Patient was personally examined, the discharge assessment and plan has been personally reviewed and I agree with ACNP Babcock's assessment and plan. 35 minutes of time have been dedicated to discharge assessment, planning and discharge instructions.   SignedBETHA Valinda Novas 09/09/2023, 10:22 AM

## 2023-09-17 DIAGNOSIS — R32 Unspecified urinary incontinence: Secondary | ICD-10-CM | POA: Diagnosis not present

## 2023-09-17 DIAGNOSIS — R159 Full incontinence of feces: Secondary | ICD-10-CM | POA: Diagnosis not present

## 2023-09-17 DIAGNOSIS — G40909 Epilepsy, unspecified, not intractable, without status epilepticus: Secondary | ICD-10-CM | POA: Diagnosis not present

## 2023-09-17 DIAGNOSIS — F840 Autistic disorder: Secondary | ICD-10-CM | POA: Diagnosis not present

## 2023-11-26 DIAGNOSIS — F840 Autistic disorder: Secondary | ICD-10-CM | POA: Diagnosis not present

## 2023-11-26 DIAGNOSIS — R3981 Functional urinary incontinence: Secondary | ICD-10-CM | POA: Diagnosis not present

## 2024-06-12 DIAGNOSIS — F840 Autistic disorder: Secondary | ICD-10-CM | POA: Diagnosis not present

## 2024-06-12 DIAGNOSIS — R3981 Functional urinary incontinence: Secondary | ICD-10-CM | POA: Diagnosis not present

## 2024-08-12 DIAGNOSIS — R3981 Functional urinary incontinence: Secondary | ICD-10-CM | POA: Diagnosis not present

## 2024-08-12 DIAGNOSIS — F840 Autistic disorder: Secondary | ICD-10-CM | POA: Diagnosis not present
